# Patient Record
Sex: Female | Born: 1935 | Smoking: Current every day smoker
Health system: Southern US, Community
[De-identification: ages and names within clinical notes are randomized; demographics above are authoritative.]

## PROBLEM LIST (undated history)

## (undated) DIAGNOSIS — K6289 Other specified diseases of anus and rectum: Secondary | ICD-10-CM

## (undated) DIAGNOSIS — G3184 Mild cognitive impairment, so stated: Secondary | ICD-10-CM

## (undated) DIAGNOSIS — I10 Essential (primary) hypertension: Secondary | ICD-10-CM

## (undated) DIAGNOSIS — I872 Venous insufficiency (chronic) (peripheral): Secondary | ICD-10-CM

## (undated) DIAGNOSIS — D649 Anemia, unspecified: Secondary | ICD-10-CM

## (undated) DIAGNOSIS — H9190 Unspecified hearing loss, unspecified ear: Secondary | ICD-10-CM

## (undated) DIAGNOSIS — N189 Chronic kidney disease, unspecified: Secondary | ICD-10-CM

## (undated) DIAGNOSIS — E119 Type 2 diabetes mellitus without complications: Secondary | ICD-10-CM

## (undated) HISTORY — DX: Anemia, unspecified: D64.9

## (undated) HISTORY — DX: Essential (primary) hypertension: I10

## (undated) HISTORY — DX: Mild cognitive impairment of uncertain or unknown etiology: G31.84

## (undated) HISTORY — DX: Chronic kidney disease, unspecified: N18.9

## (undated) HISTORY — DX: Unspecified hearing loss, unspecified ear: H91.90

---

## 2006-03-23 ENCOUNTER — Emergency Department: Payer: Self-pay | Admitting: Internal Medicine

## 2007-03-02 ENCOUNTER — Ambulatory Visit: Payer: Self-pay | Admitting: Internal Medicine

## 2007-03-03 ENCOUNTER — Ambulatory Visit: Payer: Self-pay | Admitting: Internal Medicine

## 2007-03-30 ENCOUNTER — Ambulatory Visit: Payer: Self-pay | Admitting: Internal Medicine

## 2007-04-30 ENCOUNTER — Ambulatory Visit: Payer: Self-pay | Admitting: Internal Medicine

## 2007-05-30 ENCOUNTER — Ambulatory Visit: Payer: Self-pay | Admitting: Internal Medicine

## 2007-06-30 ENCOUNTER — Ambulatory Visit: Payer: Self-pay | Admitting: Internal Medicine

## 2008-06-23 IMAGING — CR DG RIBS 2V*L*
1 series · 2 of 2 positions shown · non-contrast
Comparison: none

REASON FOR EXAM: fall rib pain
COMMENTS:

PROCEDURE:     DXR - DXR RIBS LEFT UNILATERAL  - March 23, 2006  [DATE]
RESULT:     No prior studies are available for comparison.

[Series 1: view not recorded · 0.17mm/px · 2 of 2 slices shown]
[im 1/2]
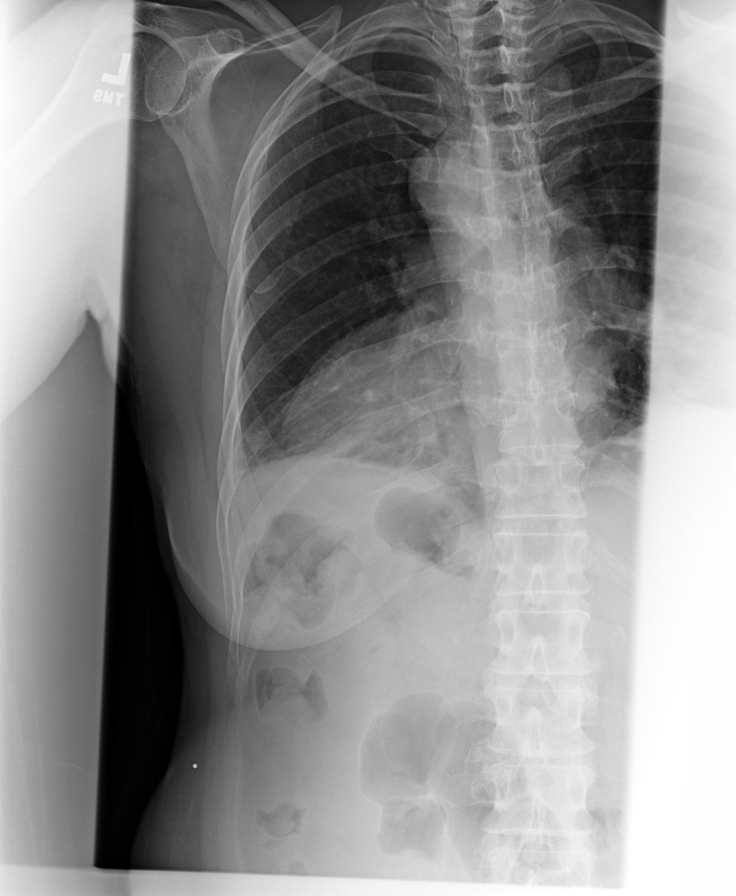
[im 2/2]
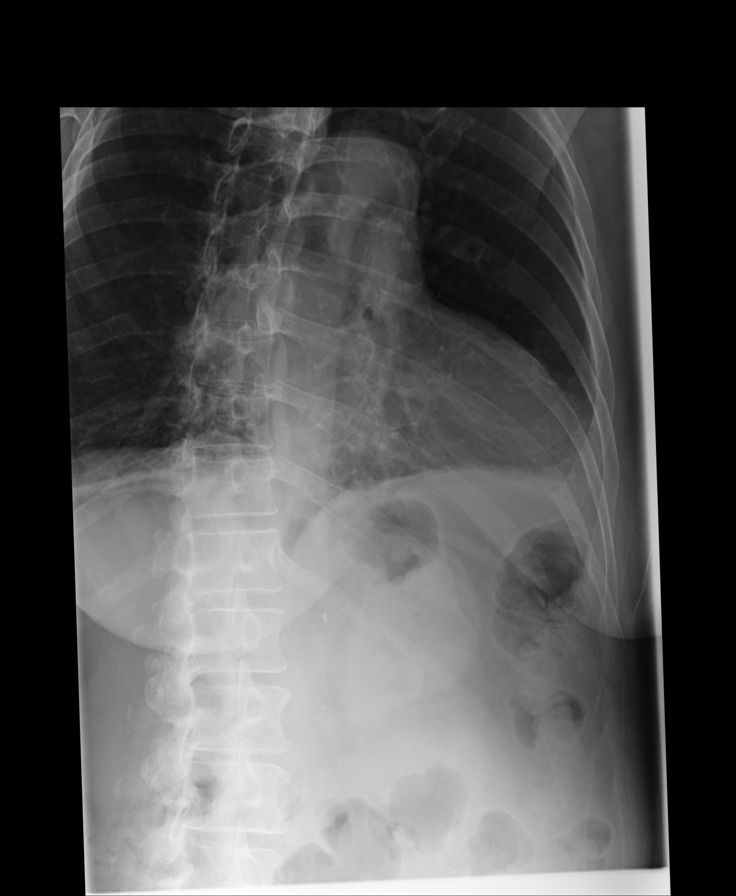

[2 of 2 positions shown; findings below may reference images not displayed]

FINDINGS: A radiopaque BB overlies the lower ribs, likely indicating the
site of pain. No displaced rib fractures are identified. There is bibasilar
subsegmental atelectasis. The remaining lungs are clear.
IMPRESSION: 1)No displaced rib fracture is identified.

## 2012-05-29 ENCOUNTER — Ambulatory Visit: Payer: Self-pay | Admitting: Ophthalmology

## 2012-05-29 DIAGNOSIS — I10 Essential (primary) hypertension: Secondary | ICD-10-CM

## 2012-06-02 ENCOUNTER — Ambulatory Visit: Payer: Self-pay | Admitting: Ophthalmology

## 2014-05-21 NOTE — Op Note (Signed)
PATIENT NAME:  Courtney Holt, BUSCH MR#:  [id number] DATE OF BIRTH:  03-26-1934  DATE OF PROCEDURE:  06/02/2012  PREOPERATIVE DIAGNOSIS:  Cataract, left eye.    POSTOPERATIVE DIAGNOSIS:  Cataract, left eye.  PROCEDURE PERFORMED:  Extracapsular cataract extraction using phacoemulsification with placement of an Alcon [id number], 26.0-diopter posterior chamber lens, serial [id number].  SURGEON:  Loura Back. Taleshia Luff, MD  ASSISTANT:  None.  ANESTHESIA:  4% lidocaine and 0.75% Marcaine in a 50/50 mixture with 10 units/mL of Hylenex added, given as a peribulbar.   ANESTHESIOLOGIST:  Dr. Benjamine Mola  COMPLICATIONS:  None.  ESTIMATED BLOOD LOSS:  Less than 1 ml.  DESCRIPTION OF PROCEDURE:  The patient was brought to the operating room and given a peribulbar block.  The patient was then prepped and draped in the usual fashion.  The vertical rectus muscles were imbricated using 5-0 silk sutures.  These sutures were then clamped to the sterile drapes as bridle sutures.  A limbal peritomy was performed extending two clock hours and hemostasis was obtained with cautery.  A partial thickness scleral groove was made at the surgical limbus and dissected anteriorly in a lamellar dissection using an Alcon crescent knife.  The anterior chamber was entered supero-temporally with a Superblade and through the lamellar dissection with a 2.6 mm keratome.  DisCoVisc was used to replace the aqueous and a continuous tear capsulorrhexis was carried out.  Hydrodissection and hydrodelineation were carried out with balanced salt and a 27 gauge canula.  The nucleus was rotated to confirm the effectiveness of the hydrodissection.  Phacoemulsification was carried out using a divide-and-conquer technique.  Total ultrasound time was 2 minutes and 20.1 seconds with an average power of 23.9 percent.  Irrigation/aspiration was used to remove the residual cortex.  DisCoVisc was used to inflate the capsule and the internal incision was  enlarged to 3 mm with the crescent knife.  The intraocular lens was folded and inserted into the capsular bag using the AcrySert delivery system.  Irrigation/aspiration was used to remove the residual DisCoVisc.  Miostat was injected into the anterior chamber through the paracentesis track to inflate the anterior chamber and induce miosis. A tenth of a milliliter of cefuroxime was placed into the anterior chamber via the paracentesis tract. The wound was checked for leaks and wound leakage was found.  A single 10-0 suture was placed across the incision, tied and the knot was rotated superiorly.  The conjunctiva was closed with cautery and the bridle sutures were removed.  Two drops of 0.3% Vigamox were placed on the eye.   An eye shield was placed on the eye.  The patient was discharged to the recovery room in good condition. ____________________________ Loura Back Jozsef Wescoat, MD sad:sb D: 06/02/2012 14:09:22 ET T: 06/02/2012 15:01:00 ET JOB#: [id number]  cc: Remo Lipps A. Heliodoro Domagalski, MD, <Dictator>  Martie Lee MD ELECTRONICALLY SIGNED 06/09/2012 14:41

## 2016-08-26 ENCOUNTER — Encounter: Payer: Self-pay | Admitting: Emergency Medicine

## 2016-08-26 ENCOUNTER — Emergency Department
Admission: EM | Admit: 2016-08-26 | Discharge: 2016-08-26 | Discharge status: Against Medical Advice | Payer: Medicare Other | Attending: Emergency Medicine | Admitting: Emergency Medicine

## 2016-08-26 DIAGNOSIS — T50901A Poisoning by unspecified drugs, medicaments and biological substances, accidental (unintentional), initial encounter: Secondary | ICD-10-CM

## 2016-08-26 DIAGNOSIS — D649 Anemia, unspecified: Secondary | ICD-10-CM | POA: Insufficient documentation

## 2016-08-26 DIAGNOSIS — R7989 Other specified abnormal findings of blood chemistry: Secondary | ICD-10-CM

## 2016-08-26 DIAGNOSIS — I10 Essential (primary) hypertension: Secondary | ICD-10-CM | POA: Insufficient documentation

## 2016-08-26 DIAGNOSIS — R778 Other specified abnormalities of plasma proteins: Secondary | ICD-10-CM

## 2016-08-26 DIAGNOSIS — F17200 Nicotine dependence, unspecified, uncomplicated: Secondary | ICD-10-CM | POA: Insufficient documentation

## 2016-08-26 DIAGNOSIS — R748 Abnormal levels of other serum enzymes: Secondary | ICD-10-CM | POA: Diagnosis not present

## 2016-08-26 DIAGNOSIS — T447X1A Poisoning by beta-adrenoreceptor antagonists, accidental (unintentional), initial encounter: Secondary | ICD-10-CM | POA: Diagnosis not present

## 2016-08-26 DIAGNOSIS — N289 Disorder of kidney and ureter, unspecified: Secondary | ICD-10-CM | POA: Insufficient documentation

## 2016-08-26 HISTORY — DX: Essential (primary) hypertension: I10

## 2016-08-26 LAB — CBC WITH DIFFERENTIAL/PLATELET
BASOS PCT: 0 %
Basophils Absolute: 0 10*3/uL (ref 0–0.1)
EOS ABS: 0 10*3/uL (ref 0–0.7)
EOS PCT: 1 %
HCT: 19.4 % — ABNORMAL LOW (ref 35.0–47.0)
Hemoglobin: 6.3 g/dL — ABNORMAL LOW (ref 12.0–16.0)
LYMPHS ABS: 2.6 10*3/uL (ref 1.0–3.6)
Lymphocytes Relative: 39 %
MCH: 28.8 pg (ref 26.0–34.0)
MCHC: 32.6 g/dL (ref 32.0–36.0)
MCV: 88.3 fL (ref 80.0–100.0)
MONOS PCT: 7 %
Monocytes Absolute: 0.4 10*3/uL (ref 0.2–0.9)
Neutro Abs: 3.4 10*3/uL (ref 1.4–6.5)
Neutrophils Relative %: 53 %
PLATELETS: 481 10*3/uL — AB (ref 150–440)
RBC: 2.2 MIL/uL — ABNORMAL LOW (ref 3.80–5.20)
RDW: 16.3 % — ABNORMAL HIGH (ref 11.5–14.5)
WBC: 6.5 10*3/uL (ref 3.6–11.0)

## 2016-08-26 LAB — COMPREHENSIVE METABOLIC PANEL
ALBUMIN: 3.7 g/dL (ref 3.5–5.0)
ALK PHOS: 82 U/L (ref 38–126)
ALT: 6 U/L — ABNORMAL LOW (ref 14–54)
AST: 14 U/L — AB (ref 15–41)
Anion gap: 7 (ref 5–15)
BUN: 16 mg/dL (ref 6–20)
CALCIUM: 9 mg/dL (ref 8.9–10.3)
CHLORIDE: 110 mmol/L (ref 101–111)
CO2: 22 mmol/L (ref 22–32)
CREATININE: 1.97 mg/dL — AB (ref 0.44–1.00)
GFR calc non Af Amer: 23 mL/min — ABNORMAL LOW (ref >60)
GFR, EST AFRICAN AMERICAN: 26 mL/min — AB (ref >60)
GLUCOSE: 124 mg/dL — AB (ref 65–99)
Potassium: 4.4 mmol/L (ref 3.5–5.1)
Sodium: 139 mmol/L (ref 135–145)
Total Bilirubin: 0.2 mg/dL — ABNORMAL LOW (ref 0.3–1.2)
Total Protein: 6.7 g/dL (ref 6.5–8.1)

## 2016-08-26 LAB — TROPONIN I: Troponin I: 0.05 ng/mL (ref <0.03)

## 2016-08-26 NOTE — ED Provider Notes (Signed)
[facility] Emergency Department Provider Note       Time seen: ----------------------------------------- 9:18 PM on 08/26/2016 -----------------------------------------     I have reviewed the triage vital signs and the nursing notes.   HISTORY   Chief Complaint Drug Overdose    HPI Alondra Vandeven is a 82 y.o. female who presents to the ED for accidental medication ingestion. Patient presents to the ER after taking 3 of her propranolol. Patient states the first dose was taken at 10 AM and then gradually throughout the day she had taken several more. She arrives with an elevated blood pressure and normal heart rate. She denies any physical complaints.   Past Medical History:  Diagnosis Date  . Hypertension     There are no active problems to display for this patient.   History reviewed. No pertinent surgical history.  Allergies Penicillins  Social History Social History  Substance Use Topics  . Smoking status: Current Every Day Smoker  . Smokeless tobacco: Never Used  . Alcohol use Yes    Review of Systems Constitutional: Negative for fever. Eyes: Negative for vision changes ENT:  Negative for congestion, sore throat Cardiovascular: Negative for chest pain. Respiratory: Negative for shortness of breath. Gastrointestinal: Negative for abdominal pain, vomiting and diarrhea. Genitourinary: Negative for dysuria. Musculoskeletal: Negative for back pain. Skin: Negative for rash. Neurological: Negative for headaches, focal weakness or numbness.  All systems negative/normal/unremarkable except as stated in the HPI  ____________________________________________   PHYSICAL EXAM:  VITAL SIGNS: ED Triage Vitals  Enc Vitals Group     BP 08/26/16 1859 (!) 160/123     Pulse Rate 08/26/16 1859 81     Resp 08/26/16 1859 16     Temp 08/26/16 1859 99.6 F (37.6 C)     Temp Source 08/26/16 1859 Oral     SpO2 08/26/16 1859 100 %     Weight  08/26/16 1855 101 lb (45.8 kg)     Height 08/26/16 1855 5' (1.524 m)     Head Circumference --      Peak Flow --      Pain Score --      Pain Loc --      Pain Edu? --      Excl. in [place]? --     Constitutional: Well appearing and in no distress. Eyes: Conjunctivae are normal. Normal extraocular movements. ENT   Head: Normocephalic and atraumatic.   Nose: No congestion/rhinnorhea.   Mouth/Throat: Mucous membranes are moist.   Neck: No stridor. Cardiovascular: Normal rate, regular rhythm. No murmurs, rubs, or gallops. Respiratory: Normal respiratory effort without tachypnea nor retractions. Breath sounds are clear and equal bilaterally. No wheezes/rales/rhonchi. Gastrointestinal: Soft and nontender. Normal bowel sounds Musculoskeletal: Nontender with normal range of motion in extremities. No lower extremity tenderness nor edema. Neurologic:  Normal speech and language. No gross focal neurologic deficits are appreciated. Tremor is noted Skin:  Skin is warm, dry and intact. Pallor is noted Psychiatric: Mood and affect are normal.  ____________________________________________  EKG: Interpreted by me. Sinus rhythm with a rate of 77 bpm, normal PR interval, normal QRS size, normal QT. Possible septal infarct age indeterminate  ____________________________________________  ED COURSE:  Pertinent labs & imaging results that were available during my care of the patient were reviewed by me and considered in my medical decision making (see chart for details). Patient presents for accidental medication ingestion, we will assess with labs and imaging as indicated.   Procedures ____________________________________________   LABS (  pertinent positives/negatives)  Labs Reviewed  CBC WITH DIFFERENTIAL/PLATELET - Abnormal; Notable for the following:       Result Value   RBC 2.20 (*)    Hemoglobin 6.3 (*)    HCT 19.4 (*)    RDW 16.3 (*)    Platelets 481 (*)    All other components  within normal limits  COMPREHENSIVE METABOLIC PANEL - Abnormal; Notable for the following:    Glucose, Bld 124 (*)    Creatinine, Ser 1.97 (*)    AST 14 (*)    ALT 6 (*)    Total Bilirubin 0.2 (*)    GFR calc non Af Amer 23 (*)    GFR calc Af Amer 26 (*)    All other components within normal limits  TROPONIN I - Abnormal; Notable for the following:    Troponin I 0.05 (*)    All other components within normal limits   ____________________________________________  FINAL ASSESSMENT AND PLAN  Accidental medication ingestion, Anemia, elevated troponin, renal insufficiency  Plan: Patient's labs were dictated above. Patient had presented for possible overdose on propranolol. At this point her heart rate and blood pressure are actually elevated and she appears stable from that perspective. However we have found multiple other issues that need to be addressed including anemia that qualifies for blood transfusion, renal insufficiency and elevated troponin. These may all be related to chronic renal insufficiency. However she has refused rectal examination, she has refused any further inpatient testing. I have advised her and her family cannot guarantee her safety. She is signing [place] paperwork. The family realizes they need to establish decision making capacity for her in that she likely is developing dementia, but seems to understand her current situation at this time and does not want to stay in the hospital.   Earleen Newport, MD   Note: This note was generated in part or whole with voice recognition software. Voice recognition is usually quite accurate but there are transcription errors that can and very often do occur. I apologize for any typographical errors that were not detected and corrected.     Earleen Newport, MD 08/26/16 2233

## 2016-08-26 NOTE — ED Triage Notes (Signed)
Pt to ED after taking 3 of her Propanilol. The first one was taken at 10 am and then gradually throughout the day. Pt's current BP is 160/123 and HR 81.

## 2016-08-26 NOTE — ED Notes (Addendum)
Pt refusing provider to exam her at this time. Pt agitated and wanting to leave. MD at bedside.

## 2016-08-26 NOTE — ED Notes (Addendum)
Health information provided to pt by MD about conditions discovered during ED visit. Pt and family verbalized understanding of information provided. Pt still adamantly refusing treatment and states "I'm not staying, I want to go home." Pt signed paper and computer copy of AMA form.

## 2021-10-24 ENCOUNTER — Inpatient Hospital Stay
Admission: EM | Admit: 2021-10-24 | Discharge: 2021-10-27 | DRG: 637 | Disposition: A | Discharge status: Home / Self Care | Payer: Medicare Other | Attending: Internal Medicine | Admitting: Internal Medicine

## 2021-10-24 ENCOUNTER — Emergency Department: Payer: Medicare Other

## 2021-10-24 DIAGNOSIS — I509 Heart failure, unspecified: Secondary | ICD-10-CM | POA: Diagnosis not present

## 2021-10-24 DIAGNOSIS — E1122 Type 2 diabetes mellitus with diabetic chronic kidney disease: Secondary | ICD-10-CM | POA: Diagnosis present

## 2021-10-24 DIAGNOSIS — N184 Chronic kidney disease, stage 4 (severe): Secondary | ICD-10-CM | POA: Diagnosis present

## 2021-10-24 DIAGNOSIS — E785 Hyperlipidemia, unspecified: Secondary | ICD-10-CM | POA: Diagnosis present

## 2021-10-24 DIAGNOSIS — K922 Gastrointestinal hemorrhage, unspecified: Secondary | ICD-10-CM | POA: Diagnosis present

## 2021-10-24 DIAGNOSIS — Z7982 Long term (current) use of aspirin: Secondary | ICD-10-CM

## 2021-10-24 DIAGNOSIS — E162 Hypoglycemia, unspecified: Secondary | ICD-10-CM | POA: Diagnosis present

## 2021-10-24 DIAGNOSIS — E861 Hypovolemia: Secondary | ICD-10-CM | POA: Diagnosis present

## 2021-10-24 DIAGNOSIS — R319 Hematuria, unspecified: Secondary | ICD-10-CM | POA: Diagnosis not present

## 2021-10-24 DIAGNOSIS — N179 Acute kidney failure, unspecified: Secondary | ICD-10-CM | POA: Diagnosis not present

## 2021-10-24 DIAGNOSIS — Z880 Allergy status to penicillin: Secondary | ICD-10-CM

## 2021-10-24 DIAGNOSIS — K623 Rectal prolapse: Secondary | ICD-10-CM | POA: Diagnosis present

## 2021-10-24 DIAGNOSIS — F17210 Nicotine dependence, cigarettes, uncomplicated: Secondary | ICD-10-CM | POA: Diagnosis present

## 2021-10-24 DIAGNOSIS — N189 Chronic kidney disease, unspecified: Secondary | ICD-10-CM | POA: Diagnosis present

## 2021-10-24 DIAGNOSIS — Z833 Family history of diabetes mellitus: Secondary | ICD-10-CM

## 2021-10-24 DIAGNOSIS — E11649 Type 2 diabetes mellitus with hypoglycemia without coma: Principal | ICD-10-CM | POA: Diagnosis present

## 2021-10-24 DIAGNOSIS — I5032 Chronic diastolic (congestive) heart failure: Secondary | ICD-10-CM | POA: Diagnosis present

## 2021-10-24 DIAGNOSIS — R4182 Altered mental status, unspecified: Secondary | ICD-10-CM

## 2021-10-24 DIAGNOSIS — Z8249 Family history of ischemic heart disease and other diseases of the circulatory system: Secondary | ICD-10-CM

## 2021-10-24 DIAGNOSIS — I130 Hypertensive heart and chronic kidney disease with heart failure and stage 1 through stage 4 chronic kidney disease, or unspecified chronic kidney disease: Secondary | ICD-10-CM | POA: Diagnosis present

## 2021-10-24 DIAGNOSIS — G9341 Metabolic encephalopathy: Secondary | ICD-10-CM | POA: Diagnosis present

## 2021-10-24 DIAGNOSIS — I10 Essential (primary) hypertension: Secondary | ICD-10-CM | POA: Diagnosis present

## 2021-10-24 DIAGNOSIS — K921 Melena: Secondary | ICD-10-CM | POA: Diagnosis present

## 2021-10-24 DIAGNOSIS — N2581 Secondary hyperparathyroidism of renal origin: Secondary | ICD-10-CM | POA: Diagnosis present

## 2021-10-24 DIAGNOSIS — H9190 Unspecified hearing loss, unspecified ear: Secondary | ICD-10-CM | POA: Diagnosis present

## 2021-10-24 DIAGNOSIS — D631 Anemia in chronic kidney disease: Secondary | ICD-10-CM | POA: Diagnosis present

## 2021-10-24 DIAGNOSIS — Z8673 Personal history of transient ischemic attack (TIA), and cerebral infarction without residual deficits: Secondary | ICD-10-CM

## 2021-10-24 DIAGNOSIS — D509 Iron deficiency anemia, unspecified: Secondary | ICD-10-CM | POA: Diagnosis present

## 2021-10-24 DIAGNOSIS — Z79899 Other long term (current) drug therapy: Secondary | ICD-10-CM

## 2021-10-24 DIAGNOSIS — Z20822 Contact with and (suspected) exposure to covid-19: Secondary | ICD-10-CM | POA: Diagnosis present

## 2021-10-24 DIAGNOSIS — Z7984 Long term (current) use of oral hypoglycemic drugs: Secondary | ICD-10-CM

## 2021-10-24 DIAGNOSIS — E119 Type 2 diabetes mellitus without complications: Secondary | ICD-10-CM

## 2021-10-24 LAB — CBG MONITORING, ED
Glucose-Capillary: 127 mg/dL — ABNORMAL HIGH (ref 70–99)
Glucose-Capillary: 24 mg/dL — CL (ref 70–99)
Glucose-Capillary: 61 mg/dL — ABNORMAL LOW (ref 70–99)
Glucose-Capillary: 68 mg/dL — ABNORMAL LOW (ref 70–99)
Glucose-Capillary: 71 mg/dL (ref 70–99)
Glucose-Capillary: 72 mg/dL (ref 70–99)

## 2021-10-24 LAB — COMPREHENSIVE METABOLIC PANEL
ALT: 12 U/L (ref 0–44)
AST: 27 U/L (ref 15–41)
Albumin: 3.1 g/dL — ABNORMAL LOW (ref 3.5–5.0)
Alkaline Phosphatase: 75 U/L (ref 38–126)
Anion gap: 8 (ref 5–15)
BUN: 32 mg/dL — ABNORMAL HIGH (ref 8–23)
CO2: 19 mmol/L — ABNORMAL LOW (ref 22–32)
Calcium: 8.9 mg/dL (ref 8.9–10.3)
Chloride: 117 mmol/L — ABNORMAL HIGH (ref 98–111)
Creatinine, Ser: 3.33 mg/dL — ABNORMAL HIGH (ref 0.44–1.00)
GFR, Estimated: 13 mL/min — ABNORMAL LOW (ref >60)
Glucose, Bld: 69 mg/dL — ABNORMAL LOW (ref 70–99)
Potassium: 3.5 mmol/L (ref 3.5–5.1)
Sodium: 144 mmol/L (ref 135–145)
Total Bilirubin: 0.6 mg/dL (ref 0.3–1.2)
Total Protein: 6.6 g/dL (ref 6.5–8.1)

## 2021-10-24 LAB — CBC WITH DIFFERENTIAL/PLATELET
Abs Immature Granulocytes: 0.03 10*3/uL (ref 0.00–0.07)
Basophils Absolute: 0 10*3/uL (ref 0.0–0.1)
Basophils Relative: 0 %
Eosinophils Absolute: 0 10*3/uL (ref 0.0–0.5)
Eosinophils Relative: 0 %
HCT: 31.8 % — ABNORMAL LOW (ref 36.0–46.0)
Hemoglobin: 10.1 g/dL — ABNORMAL LOW (ref 12.0–15.0)
Immature Granulocytes: 0 %
Lymphocytes Relative: 14 %
Lymphs Abs: 1.1 10*3/uL (ref 0.7–4.0)
MCH: 27.7 pg (ref 26.0–34.0)
MCHC: 31.8 g/dL (ref 30.0–36.0)
MCV: 87.1 fL (ref 80.0–100.0)
Monocytes Absolute: 0.5 10*3/uL (ref 0.1–1.0)
Monocytes Relative: 6 %
Neutro Abs: 6.1 10*3/uL (ref 1.7–7.7)
Neutrophils Relative %: 80 %
Platelets: 380 10*3/uL (ref 150–400)
RBC: 3.65 MIL/uL — ABNORMAL LOW (ref 3.87–5.11)
RDW: 20.4 % — ABNORMAL HIGH (ref 11.5–15.5)
WBC: 7.7 10*3/uL (ref 4.0–10.5)
nRBC: 0 % (ref 0.0–0.2)

## 2021-10-24 LAB — RESP PANEL BY RT-PCR (FLU A&B, COVID) ARPGX2
Influenza A by PCR: NEGATIVE
Influenza B by PCR: NEGATIVE
SARS Coronavirus 2 by RT PCR: NEGATIVE

## 2021-10-24 LAB — LACTIC ACID, PLASMA: Lactic Acid, Venous: 1 mmol/L (ref 0.5–1.9)

## 2021-10-24 LAB — BRAIN NATRIURETIC PEPTIDE: B Natriuretic Peptide: 645.4 pg/mL — ABNORMAL HIGH (ref 0.0–100.0)

## 2021-10-24 LAB — CK: Total CK: 228 U/L (ref 38–234)

## 2021-10-24 MED ORDER — TRAZODONE HCL 50 MG PO TABS
25.0000 mg | ORAL_TABLET | Freq: Every evening | ORAL | Status: DC | PRN
  Administered 2021-10-25 – 2021-10-27 (×2): 25 mg via ORAL
  Filled 2021-10-24 (×2): qty 1

## 2021-10-24 MED ORDER — ENOXAPARIN SODIUM 40 MG/0.4ML IJ SOSY: 40.0000 mg | PREFILLED_SYRINGE | INTRAMUSCULAR | Status: DC

## 2021-10-24 MED ORDER — DEXTROSE 50 % IV SOLN
1.0000 | Freq: Once | INTRAVENOUS | Status: AC
  Administered 2021-10-24: 50 mL via INTRAVENOUS
  Filled 2021-10-24: qty 50

## 2021-10-24 MED ORDER — ONDANSETRON HCL 4 MG PO TABS: 4.0000 mg | ORAL_TABLET | Freq: Four times a day (QID) | ORAL | Status: DC | PRN

## 2021-10-24 MED ORDER — FUROSEMIDE 10 MG/ML IJ SOLN
20.0000 mg | Freq: Two times a day (BID) | INTRAMUSCULAR | Status: DC
  Administered 2021-10-24 – 2021-10-26 (×4): 20 mg via INTRAVENOUS
  Filled 2021-10-24 (×4): qty 4

## 2021-10-24 MED ORDER — ACETAMINOPHEN 650 MG RE SUPP: 650.0000 mg | Freq: Four times a day (QID) | RECTAL | Status: DC | PRN

## 2021-10-24 MED ORDER — DEXTROSE-NACL 5-0.45 % IV SOLN: INTRAVENOUS | Status: DC

## 2021-10-24 MED ORDER — HEPARIN SODIUM (PORCINE) 5000 UNIT/ML IJ SOLN
5000.0000 [IU] | Freq: Three times a day (TID) | INTRAMUSCULAR | Status: DC
  Administered 2021-10-24 – 2021-10-27 (×8): 5000 [IU] via SUBCUTANEOUS
  Filled 2021-10-24 (×8): qty 1

## 2021-10-24 MED ORDER — ACETAMINOPHEN 325 MG PO TABS
650.0000 mg | ORAL_TABLET | Freq: Four times a day (QID) | ORAL | Status: DC | PRN
  Administered 2021-10-25: 650 mg via ORAL
  Filled 2021-10-24: qty 2

## 2021-10-24 MED ORDER — POLYETHYLENE GLYCOL 3350 17 G PO PACK: 17.0000 g | PACK | Freq: Every day | ORAL | Status: DC | PRN

## 2021-10-24 MED ORDER — DEXTROSE 50 % IV SOLN
INTRAVENOUS | Status: AC
  Administered 2021-10-25: 12.5 g via INTRAVENOUS
  Filled 2021-10-24: qty 50

## 2021-10-24 MED ORDER — SODIUM CHLORIDE 0.9 % IV BOLUS
1000.0000 mL | Freq: Once | INTRAVENOUS | Status: AC
  Administered 2021-10-24: 1000 mL via INTRAVENOUS

## 2021-10-24 MED ORDER — DEXTROSE-NACL 5-0.9 % IV SOLN: INTRAVENOUS | Status: DC

## 2021-10-24 MED ORDER — INSULIN ASPART 100 UNIT/ML IJ SOLN: 0.0000 [IU] | Freq: Every day | INTRAMUSCULAR | Status: DC

## 2021-10-24 MED ORDER — INSULIN ASPART 100 UNIT/ML IJ SOLN: 0.0000 [IU] | Freq: Three times a day (TID) | INTRAMUSCULAR | Status: DC

## 2021-10-24 MED ORDER — ONDANSETRON HCL 4 MG/2ML IJ SOLN
4.0000 mg | Freq: Four times a day (QID) | INTRAMUSCULAR | Status: DC | PRN
  Administered 2021-10-24 – 2021-10-25 (×2): 4 mg via INTRAVENOUS
  Filled 2021-10-24 (×2): qty 2

## 2021-10-24 MED ORDER — MAGNESIUM HYDROXIDE 400 MG/5ML PO SUSP: 30.0000 mL | Freq: Every day | ORAL | Status: DC | PRN

## 2021-10-24 MED ORDER — DEXTROSE 50 % IV SOLN: 12.5000 g | Freq: Once | INTRAVENOUS | Status: AC

## 2021-10-24 MED ORDER — POTASSIUM CHLORIDE 20 MEQ PO PACK
40.0000 meq | PACK | Freq: Once | ORAL | Status: DC
  Filled 2021-10-24: qty 2

## 2021-10-24 NOTE — Assessment & Plan Note (Signed)
continue her antihypertensives. 

## 2021-10-24 NOTE — Assessment & Plan Note (Signed)
-   She will be placed on supplement coverage with NovoLog and we will hold off her glipizide for now.

## 2021-10-24 NOTE — ED Provider Notes (Signed)
[facility] Provider Note    Event Date/Time   First MD Initiated Contact with Patient 10/24/21 1647     (approximate)   History   Hypoglycemia   HPI  Sejal Cofield is a 87 y.o. female with a history of hypertension and anemia but no other known medical problems who normally is alert and oriented baseline and who presents with altered mental status.  EMS reports that the family found her unresponsive today.  She was last seen at her baseline yesterday.  The patient herself is able to answer a few basic questions but cannot give any further history.  I reviewed the past medical records.  The patient was seen in the ED in 2018 for accidental medication ingestion.  Her most recent outpatient encounter was an iron infusion at [facility] in 2019 for iron deficiency anemia.   Physical Exam   Triage Vital Signs: ED Triage Vitals  Enc Vitals Group     BP 10/24/21 1655 (!) 165/90     Pulse Rate 10/24/21 1655 96     Resp 10/24/21 1655 (!) 22     Temp 10/24/21 1700 98.9 F (37.2 C)     Temp Source 10/24/21 1700 Oral     SpO2 10/24/21 1655 94 %     Weight --      Height --      Head Circumference --      Peak Flow --      Pain Score --      Pain Loc --      Pain Edu? --      Excl. in [place]? --     Most recent vital signs: Vitals:   10/24/21 1700 10/24/21 2300  BP:    Pulse:  (!) 105  Resp:  20  Temp: 98.9 F (37.2 C)   SpO2:  94%     General: Awake, confused appearing.  Oriented x1. CV:  Good peripheral perfusion.  Resp:  Normal effort.  Abd:  No distention.  Other:  Dry mucous membranes.  EOMI.  PERRLA.  Motor intact in all extremities.  Rectal prolapse.   ED Results / Procedures / Treatments   Labs (all labs ordered are listed, but only abnormal results are displayed) Labs Reviewed  COMPREHENSIVE METABOLIC PANEL - Abnormal; Notable for the following components:      Result Value   Chloride 117 (*)    CO2 19 (*)    Glucose, Bld 69 (*)     BUN 32 (*)    Creatinine, Ser 3.33 (*)    Albumin 3.1 (*)    GFR, Estimated 13 (*)    All other components within normal limits  CBC WITH DIFFERENTIAL/PLATELET - Abnormal; Notable for the following components:   RBC 3.65 (*)    Hemoglobin 10.1 (*)    HCT 31.8 (*)    RDW 20.4 (*)    All other components within normal limits  BRAIN NATRIURETIC PEPTIDE - Abnormal; Notable for the following components:   B Natriuretic Peptide 645.4 (*)    All other components within normal limits  CBG MONITORING, ED - Abnormal; Notable for the following components:   Glucose-Capillary 24 (*)    All other components within normal limits  CBG MONITORING, ED - Abnormal; Notable for the following components:   Glucose-Capillary 127 (*)    All other components within normal limits  CBG MONITORING, ED - Abnormal; Notable for the following components:   Glucose-Capillary 68 (*)  All other components within normal limits  RESP PANEL BY RT-PCR (FLU A&B, COVID) ARPGX2  CULTURE, BLOOD (ROUTINE X 2)  CULTURE, BLOOD (ROUTINE X 2)  LACTIC ACID, PLASMA  CK  LACTIC ACID, PLASMA  BASIC METABOLIC PANEL  CBC  HEMOGLOBIN A1C  URINALYSIS, ROUTINE W REFLEX MICROSCOPIC  CBG MONITORING, ED  CBG MONITORING, ED     EKG  ED ECG REPORT I, Arta Silence, the attending physician, personally viewed and interpreted this ECG.  Date: 10/24/2021 EKG Time: 1648 Rate: 102 Rhythm: Sinus tachycardia QRS Axis: normal Intervals: normal ST/T Wave abnormalities: Nonspecific ST abnormalities Narrative Interpretation: Nonspecific abnormalities with no evidence of acute ischemia    RADIOLOGY  CT head: I independently viewed and interpreted the images; there is no ICH  Chest x-ray: I independently viewed and interpreted the images; there is cardiomegaly with no focal consolidation or edema  PROCEDURES:  Critical Care performed: No  Hernia reduction  Date/Time: 10/24/2021 5:26 PM  Performed by: Arta Silence, MD Authorized by: Arta Silence, MD  Consent: The procedure was performed in an emergent situation. Verbal consent obtained. Consent given by: patient Patient identity confirmed: verbally with patient and arm band Patient tolerance: patient tolerated the procedure well with no immediate complications Comments: White sugar applied to the prolapsed rectal tissue.  After several minutes it significantly reduced in size.  Manual prolapse reduction was successful.      MEDICATIONS ORDERED IN ED: Medications  dextrose 5 %-0.45 % sodium chloride infusion ( Intravenous Restarted 10/24/21 2203)  acetaminophen (TYLENOL) tablet 650 mg (has no administration in time range)    Or  acetaminophen (TYLENOL) suppository 650 mg (has no administration in time range)  traZODone (DESYREL) tablet 25 mg (has no administration in time range)  ondansetron (ZOFRAN) tablet 4 mg (has no administration in time range)    Or  ondansetron (ZOFRAN) injection 4 mg (has no administration in time range)  potassium chloride (KLOR-CON) packet 40 mEq (40 mEq Oral Not Given 10/24/21 2209)  insulin aspart (novoLOG) injection 0-9 Units (has no administration in time range)  insulin aspart (novoLOG) injection 0-5 Units ( Subcutaneous Not Given 10/24/21 2118)  heparin injection 5,000 Units (5,000 Units Subcutaneous Given 10/24/21 2209)  polyethylene glycol (MIRALAX / GLYCOLAX) packet 17 g (has no administration in time range)  furosemide (LASIX) injection 20 mg (20 mg Intravenous Given 10/24/21 2208)  sodium chloride 0.9 % bolus 1,000 mL (0 mLs Intravenous Stopped 10/24/21 1942)  dextrose 50 % solution 50 mL (50 mLs Intravenous Given 10/24/21 1907)     IMPRESSION / MDM / ASSESSMENT AND PLAN / ED COURSE  I reviewed the triage vital signs and the nursing notes.  87 year old female with PMH as noted above presents with altered mental status.  On exam the patient is alert but confused and disoriented.  She is  hypertensive with otherwise normal vital signs.  Physical exam is also notable for a rectal prolapse.  Differential diagnosis includes, but is not limited to, acute infection/sepsis, dehydration, electrolyte abnormality, AKI, uremia, other metabolic disturbance, intracranial hemorrhage, acute CVA, or other CNS cause, less likely cardiac etiology.  We will obtain CT head, chest x-ray, lab work-up, and reassess.  I reviewed the past records and did not see any reference to a rectal prolapse in the prior available records.  The prolapse appears acute as the prolapse tissue is moist and well vascularized.  I attempted reduction manually which was unsuccessful.  We then applied sugar and after several minutes I was  able to reduce the prolapse without difficulty.  The patient tolerated the procedure well with no immediate complications.  Patient's presentation is most consistent with acute presentation with potential threat to life or bodily function.  The patient is on the cardiac monitor to evaluate for evidence of arrhythmia and/or significant heart rate changes.  ----------------------------------------- 8:17 PM on 10/24/2021 -----------------------------------------  The patient had recurrent hypoglycemia and I suspect that this is a primary cause of her altered mental status.  We gave an amp of D50 and I have put her on a D5 half NS infusion.  The family reports that the patient is on glipizide but not on any insulin.  Other lab work-up is overall reassuring.  The patient has a mild AKI and a slightly elevated BNP.  Lactate is normal.  CK is normal.  COVID is negative.  I consulted Dr. Sidney Ace from the hospitalist service; based on our discussion he agrees to admit the patient.    FINAL CLINICAL IMPRESSION(S) / ED DIAGNOSES   Final diagnoses:  Altered mental status, unspecified altered mental status type  Hypoglycemia     Rx / DC Orders   ED Discharge Orders     None         Note:  This document was prepared using Dragon voice recognition software and may include unintentional dictation errors.    Arta Silence, MD 10/24/21 [phone]

## 2021-10-24 NOTE — Assessment & Plan Note (Addendum)
-   This is in the setting of elevated BNP though without significant symptoms of CHF. - hold off on IV fluids. - continue diuresis with IV Lasix. - Pending 2D echo.

## 2021-10-24 NOTE — H&P (Signed)
[place]   PATIENT NAME: Courtney Holt    MR#:  [id number]  DATE OF BIRTH:  [birth year]  DATE OF ADMISSION:  10/24/2021  PRIMARY CARE PHYSICIAN: [facility], [place]   Patient is coming from: Home  REQUESTING/REFERRING PHYSICIAN: Ane Payment, MD  CHIEF COMPLAINT:   Chief Complaint  Patient presents with   Hypoglycemia    HISTORY OF PRESENT ILLNESS:  Jacinda Kanady is a 87 y.o. African-American female with medical history significant for essential anemia, hypertension and type 2 diabetes mellitus, who presented to the emergency room with acute onset of altered mental status with unresponsiveness earlier today.  The patient was last seen at her normal state of health at 9 AM by family and when they came back around 3 PM they found her on the floor.  Her blood glucose was 34 initially.  She was so weak per her daughter and could not comprehend her.  She was in and out of responsiveness per her daughter.  She denies any significant dyspnea or cough or wheezing.  No significant orthopnea or paroxysmal nocturnal dyspnea.  She has occasional lower extremity edema per her daughter.  No chest pain or palpitations.  No nausea or vomiting or diarrhea.  She was noted to have rectal prolapse by the ER physician that was reduced.  ED Course: When she the ER, BP was 165/90 with respiratory rate of 22 and otherwise normal vital signs.  Labs revealed borderline potassium of 3.5 and CO2 of 19 with a glucose of 69 and BUN 32 with a creatinine of 3.33 compared to 16/1.97 on 08/26/2016.  Total CK was 228 and lactic acid 1.  CBC showed anemia better than previous levels with hemoglobin of 10.1 hematocrit 31.8.  Influenza antigens and COVID-19 PCR came back negative.  2 blood cultures were drawn.  I ordered a BNP and it came back elevated at 645.4 EKG as reviewed by me : EKG showed sinus tachycardia with a rate of 102 and multiple premature ventricular complexes and anteroseptal Q  waves. Imaging: Noncontrasted CT scan revealed a chronic right posterior parietal and left frontal parietal infarcts, small bilateral basal ganglia lacunar infarcts and generalized cerebral atrophy with chronic white matter small vessel ischemic changes without evidence of acute intracranial abnormality.  Portable chest x-ray showed cardiomegaly with vascular congestion and pleural effusions.  The patient was given an amp of D50 followed by D5 half-normal saline at 75 mill per hour.  She will be admitted to an observation medical telemetry bed for further evaluation and management. PAST MEDICAL HISTORY:   Past Medical History:  Diagnosis Date   Hypertension   -Type II diabetes mellitus without complications. -History of iron deficiency anemia and iron transfusion. - Dyslipidemia. PAST SURGICAL HISTORY:  History reviewed. No pertinent surgical history.  None.  SOCIAL HISTORY:   Social History   Tobacco Use   Smoking status: Every Day   Smokeless tobacco: Never  Substance Use Topics   Alcohol use: Yes    FAMILY HISTORY:  Positive for hypertension, diabetes mellitus, MI in her mother and CVA in her sister.  DRUG ALLERGIES:   Allergies  Allergen Reactions   Penicillins     REVIEW OF SYSTEMS:   ROS As per history of present illness. All pertinent systems were reviewed above. Constitutional, HEENT, cardiovascular, respiratory, GI, GU, musculoskeletal, neuro, psychiatric, endocrine, integumentary and hematologic systems were reviewed and are otherwise negative/unremarkable except for positive findings mentioned above in the HPI.   MEDICATIONS  AT HOME:   Prior to Admission medications   Not on File      VITAL SIGNS:  Blood pressure (!) 165/90, pulse 96, temperature 98.9 F (37.2 C), temperature source Oral, resp. rate (!) 22, SpO2 94 %.  PHYSICAL EXAMINATION:  Physical Exam  GENERAL:  87 y.o.-year-old African-American female patient lying in the bed with no acute  distress.  EYES: Pupils equal, round, reactive to light and accommodation. No scleral icterus. Extraocular muscles intact.  HEENT: Head atraumatic, normocephalic. Oropharynx and nasopharynx clear.  NECK:  Supple, no jugular venous distention. No thyroid enlargement, no tenderness.  LUNGS: Normal breath sounds bilaterally, no wheezing, rales,rhonchi or crepitation. No use of accessory muscles of respiration.  CARDIOVASCULAR: Regular rate and rhythm, S1, S2 normal. No murmurs, rubs, or gallops.  ABDOMEN: Soft, nondistended, nontender. Bowel sounds present. No organomegaly or mass.  EXTREMITIES: No pedal edema, cyanosis, or clubbing.  NEUROLOGIC: Cranial nerves II through XII are intact. Muscle strength 5/5 in all extremities. Sensation intact. Gait not checked.  PSYCHIATRIC: The patient is alert and oriented x 3.  Normal affect and good eye contact. SKIN: No obvious rash, lesion, or ulcer.   LABORATORY PANEL:   CBC Recent Labs  Lab 10/24/21 1700  WBC 7.7  HGB 10.1*  HCT 31.8*  PLT 380   ------------------------------------------------------------------------------------------------------------------  Chemistries  Recent Labs  Lab 10/24/21 1700  NA 144  K 3.5  CL 117*  CO2 19*  GLUCOSE 69*  BUN 32*  CREATININE 3.33*  CALCIUM 8.9  AST 27  ALT 12  ALKPHOS 75  BILITOT 0.6   ------------------------------------------------------------------------------------------------------------------  Cardiac Enzymes No results for input(s): "TROPONINI" in the last 168 hours. ------------------------------------------------------------------------------------------------------------------  RADIOLOGY:  CT Head Wo Contrast  Result Date: 10/24/2021 CLINICAL DATA:  Altered mental status. EXAM: CT HEAD WITHOUT CONTRAST TECHNIQUE: Contiguous axial images were obtained from the base of the skull through the vertex without intravenous contrast. RADIATION DOSE REDUCTION: This exam was performed  according to the departmental dose-optimization program which includes automated exposure control, adjustment of the mA and/or kV according to patient size and/or use of iterative reconstruction technique. COMPARISON:  None Available. FINDINGS: Brain: There is moderate severity cerebral atrophy with widening of the extra-axial spaces and ventricular dilatation. There are areas of decreased attenuation within the white matter tracts of the supratentorial brain, consistent with microvascular disease changes. 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 Small, bilateral basal ganglia lacunar infarcts are noted. Vascular: No hyperdense vessel or unexpected calcification. Skull: Normal. Negative for fracture or focal lesion. Sinuses/Orbits: No acute finding. Other: None. IMPRESSION: 1. Generalized cerebral atrophy and chronic white matter small vessel ischemic changes without evidence of an acute intracranial abnormality. 2. 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 3. Small, bilateral basal ganglia lacunar infarcts. Electronically Signed   By: Virgina Norfolk M.D.   On: 10/24/2021 17:42   DG Chest Port 1 View  Result Date: 10/24/2021 CLINICAL DATA:  Unresponsive EXAM: PORTABLE CHEST 1 VIEW COMPARISON:  03/23/2006 FINDINGS: Cardiomegaly with at least small bilateral effusions. Probable skin fold artifact right chest. Vascular congestion. Patchy atelectasis at the bases. No definitive pneumothorax. IMPRESSION: Cardiomegaly with vascular congestion and pleural effusions. Electronically Signed   By: Donavan Foil M.D.   On: 10/24/2021 17:12      IMPRESSION AND PLAN:  Assessment and Plan: * Hypoglycemia - The patient was admitted to an observation medical telemetry bed. - We will hold off her glipizide. - She will be placed  on supplement coverage with NovoLog. - We will utilize hypoglycemia protocol.  Pleural effusion due to CHF (congestive heart  failure) (HCC) - This is in the setting of elevated BNP though without significant symptoms of CHF. - We will hold off on IV fluids. - We will diurese with IV Lasix. - We will check a 2D echo.  Acute kidney injury superimposed on chronic kidney disease ([place]) - This is acute kidney injury superimposed on stage IV chronic kidney disease. - This is likely prerenal and it could be related to mild acute CHF due to renal hypoperfusion. -We will follow BMPs with current diuresis.  Essential hypertension - We will continue her antihypertensives.  Diabetes mellitus type 2, controlled, without complications ([place]) - She will be placed on supplement coverage with NovoLog and we will hold off her glipizide for now.  Dyslipidemia - We will continue statin therapy.  History of CVA (cerebrovascular accident) - We will continue aspirin.   DVT prophylaxis: Subcutaneous heparin. Advanced Care Planning:  Code Status: full code. Family Communication:  The plan of care was discussed in details with the patient (and family). I answered all questions. The patient agreed to proceed with the above mentioned plan. Further management will depend upon hospital course. Disposition Plan: Back to previous home environment Consults called: none. All the records are reviewed and case discussed with ED provider.  Status is: Observation  I certify that at the time of admission, it is my clinical judgment that the patient will require hospital care extending less than 2 midnights.                            Dispo: The patient is from: Home              Anticipated d/c is to: Home              Patient currently is not medically stable to d/c.              Difficult to place patient: No  Christel Mormon M.D on 10/24/2021 at 10:15 PM  [facility]   From 7 PM-7 AM, contact night-coverage [email or web address]  CC: Primary care physician; [employer], [employer]

## 2021-10-24 NOTE — ED Notes (Addendum)
Pt found to have rectal prolapse, MD Siadecki made aware.

## 2021-10-24 NOTE — Assessment & Plan Note (Signed)
-   The patient was admitted to an observation medical telemetry bed. - We will hold off her glipizide. - She will be placed on supplement coverage with NovoLog. - We will utilize hypoglycemia protocol.

## 2021-10-24 NOTE — Assessment & Plan Note (Signed)
Holding aspirin due to concern for GI bleed

## 2021-10-24 NOTE — ED Triage Notes (Signed)
Pt to ED via EMS from home. Pt was found unresponsive by family on scene. EMS reports CBG at 34 and gave 200 of D10 which rose to 218. Last known well was yesterday. Last CBG at 138  Pt has hx of diabetes and HTN.   Oral 98.7 Co2 20-25 138 cbg BP 170/105

## 2021-10-24 NOTE — Assessment & Plan Note (Signed)
-   This is acute kidney injury superimposed on stage IV chronic kidney disease. - This is likely prerenal and it could be related to mild acute CHF due to renal hypoperfusion. -We will follow BMPs with current diuresis.

## 2021-10-24 NOTE — Assessment & Plan Note (Signed)
-   continue statin therapy. 

## 2021-10-25 ENCOUNTER — Observation Stay (HOSPITAL_COMMUNITY)
Admit: 2021-10-25 | Discharge: 2021-10-25 | Disposition: A | Discharge status: Home / Self Care | Payer: Medicare Other | Attending: Family Medicine | Admitting: Family Medicine

## 2021-10-25 ENCOUNTER — Other Ambulatory Visit: Payer: Self-pay

## 2021-10-25 DIAGNOSIS — R319 Hematuria, unspecified: Secondary | ICD-10-CM | POA: Diagnosis not present

## 2021-10-25 DIAGNOSIS — E785 Hyperlipidemia, unspecified: Secondary | ICD-10-CM | POA: Diagnosis present

## 2021-10-25 DIAGNOSIS — E861 Hypovolemia: Secondary | ICD-10-CM | POA: Diagnosis present

## 2021-10-25 DIAGNOSIS — E11649 Type 2 diabetes mellitus with hypoglycemia without coma: Secondary | ICD-10-CM | POA: Diagnosis present

## 2021-10-25 DIAGNOSIS — E1122 Type 2 diabetes mellitus with diabetic chronic kidney disease: Secondary | ICD-10-CM | POA: Diagnosis present

## 2021-10-25 DIAGNOSIS — I130 Hypertensive heart and chronic kidney disease with heart failure and stage 1 through stage 4 chronic kidney disease, or unspecified chronic kidney disease: Secondary | ICD-10-CM | POA: Diagnosis present

## 2021-10-25 DIAGNOSIS — G9341 Metabolic encephalopathy: Secondary | ICD-10-CM | POA: Diagnosis present

## 2021-10-25 DIAGNOSIS — R4182 Altered mental status, unspecified: Secondary | ICD-10-CM | POA: Diagnosis not present

## 2021-10-25 DIAGNOSIS — Z7189 Other specified counseling: Secondary | ICD-10-CM | POA: Diagnosis not present

## 2021-10-25 DIAGNOSIS — I5031 Acute diastolic (congestive) heart failure: Secondary | ICD-10-CM | POA: Diagnosis not present

## 2021-10-25 DIAGNOSIS — D631 Anemia in chronic kidney disease: Secondary | ICD-10-CM | POA: Diagnosis present

## 2021-10-25 DIAGNOSIS — K922 Gastrointestinal hemorrhage, unspecified: Secondary | ICD-10-CM | POA: Diagnosis present

## 2021-10-25 DIAGNOSIS — F17210 Nicotine dependence, cigarettes, uncomplicated: Secondary | ICD-10-CM | POA: Diagnosis present

## 2021-10-25 DIAGNOSIS — Z8249 Family history of ischemic heart disease and other diseases of the circulatory system: Secondary | ICD-10-CM | POA: Diagnosis not present

## 2021-10-25 DIAGNOSIS — I10 Essential (primary) hypertension: Secondary | ICD-10-CM | POA: Diagnosis not present

## 2021-10-25 DIAGNOSIS — H9190 Unspecified hearing loss, unspecified ear: Secondary | ICD-10-CM | POA: Diagnosis present

## 2021-10-25 DIAGNOSIS — Z20822 Contact with and (suspected) exposure to covid-19: Secondary | ICD-10-CM | POA: Diagnosis present

## 2021-10-25 DIAGNOSIS — N2581 Secondary hyperparathyroidism of renal origin: Secondary | ICD-10-CM | POA: Diagnosis present

## 2021-10-25 DIAGNOSIS — Z8673 Personal history of transient ischemic attack (TIA), and cerebral infarction without residual deficits: Secondary | ICD-10-CM | POA: Diagnosis not present

## 2021-10-25 DIAGNOSIS — Z7984 Long term (current) use of oral hypoglycemic drugs: Secondary | ICD-10-CM | POA: Diagnosis not present

## 2021-10-25 DIAGNOSIS — K921 Melena: Secondary | ICD-10-CM | POA: Diagnosis present

## 2021-10-25 DIAGNOSIS — D509 Iron deficiency anemia, unspecified: Secondary | ICD-10-CM | POA: Diagnosis present

## 2021-10-25 DIAGNOSIS — Z7982 Long term (current) use of aspirin: Secondary | ICD-10-CM | POA: Diagnosis not present

## 2021-10-25 DIAGNOSIS — I509 Heart failure, unspecified: Secondary | ICD-10-CM | POA: Diagnosis not present

## 2021-10-25 DIAGNOSIS — I5032 Chronic diastolic (congestive) heart failure: Secondary | ICD-10-CM | POA: Diagnosis present

## 2021-10-25 DIAGNOSIS — Z880 Allergy status to penicillin: Secondary | ICD-10-CM | POA: Diagnosis not present

## 2021-10-25 DIAGNOSIS — N184 Chronic kidney disease, stage 4 (severe): Secondary | ICD-10-CM | POA: Diagnosis present

## 2021-10-25 DIAGNOSIS — N179 Acute kidney failure, unspecified: Secondary | ICD-10-CM | POA: Diagnosis present

## 2021-10-25 DIAGNOSIS — K623 Rectal prolapse: Secondary | ICD-10-CM | POA: Diagnosis present

## 2021-10-25 DIAGNOSIS — E162 Hypoglycemia, unspecified: Secondary | ICD-10-CM | POA: Diagnosis present

## 2021-10-25 DIAGNOSIS — Z833 Family history of diabetes mellitus: Secondary | ICD-10-CM | POA: Diagnosis not present

## 2021-10-25 LAB — ECHOCARDIOGRAM COMPLETE
AR max vel: 1.72 cm2
AV Area VTI: 1.71 cm2
AV Area mean vel: 1.6 cm2
AV Mean grad: 8 mmHg
AV Peak grad: 13.1 mmHg
Ao pk vel: 1.81 m/s
Area-P 1/2: 3.43 cm2
S' Lateral: 2.6 cm

## 2021-10-25 LAB — CBC
HCT: 28.6 % — ABNORMAL LOW (ref 36.0–46.0)
Hemoglobin: 8.9 g/dL — ABNORMAL LOW (ref 12.0–15.0)
MCH: 27.6 pg (ref 26.0–34.0)
MCHC: 31.1 g/dL (ref 30.0–36.0)
MCV: 88.5 fL (ref 80.0–100.0)
Platelets: 361 10*3/uL (ref 150–400)
RBC: 3.23 MIL/uL — ABNORMAL LOW (ref 3.87–5.11)
RDW: 20.4 % — ABNORMAL HIGH (ref 11.5–15.5)
WBC: 10 10*3/uL (ref 4.0–10.5)
nRBC: 0 % (ref 0.0–0.2)

## 2021-10-25 LAB — BASIC METABOLIC PANEL
Anion gap: 8 (ref 5–15)
BUN: 29 mg/dL — ABNORMAL HIGH (ref 8–23)
CO2: 16 mmol/L — ABNORMAL LOW (ref 22–32)
Calcium: 8.1 mg/dL — ABNORMAL LOW (ref 8.9–10.3)
Chloride: 120 mmol/L — ABNORMAL HIGH (ref 98–111)
Creatinine, Ser: 3.09 mg/dL — ABNORMAL HIGH (ref 0.44–1.00)
GFR, Estimated: 14 mL/min — ABNORMAL LOW (ref >60)
Glucose, Bld: 85 mg/dL (ref 70–99)
Potassium: 4.1 mmol/L (ref 3.5–5.1)
Sodium: 144 mmol/L (ref 135–145)

## 2021-10-25 LAB — URINALYSIS, ROUTINE W REFLEX MICROSCOPIC
Bacteria, UA: NONE SEEN
Bilirubin Urine: NEGATIVE
Glucose, UA: NEGATIVE mg/dL
Ketones, ur: NEGATIVE mg/dL
Leukocytes,Ua: NEGATIVE
Nitrite: NEGATIVE
Protein, ur: 100 mg/dL — AB
Specific Gravity, Urine: 1.008 (ref 1.005–1.030)
pH: 5 (ref 5.0–8.0)

## 2021-10-25 LAB — CBG MONITORING, ED
Glucose-Capillary: 105 mg/dL — ABNORMAL HIGH (ref 70–99)
Glucose-Capillary: 108 mg/dL — ABNORMAL HIGH (ref 70–99)
Glucose-Capillary: 78 mg/dL (ref 70–99)
Glucose-Capillary: 91 mg/dL (ref 70–99)
Glucose-Capillary: 97 mg/dL (ref 70–99)

## 2021-10-25 LAB — HEMOGLOBIN AND HEMATOCRIT, BLOOD
HCT: 27.7 % — ABNORMAL LOW (ref 36.0–46.0)
Hemoglobin: 8.7 g/dL — ABNORMAL LOW (ref 12.0–15.0)

## 2021-10-25 LAB — HEMOGLOBIN A1C
Hgb A1c MFr Bld: 4.9 % (ref 4.8–5.6)
Mean Plasma Glucose: 93.93 mg/dL

## 2021-10-25 LAB — GLUCOSE, CAPILLARY
Glucose-Capillary: 86 mg/dL (ref 70–99)
Glucose-Capillary: 82 mg/dL (ref 70–99)

## 2021-10-25 LAB — LACTIC ACID, PLASMA: Lactic Acid, Venous: 0.7 mmol/L (ref 0.5–1.9)

## 2021-10-25 MED ORDER — PANTOPRAZOLE SODIUM 40 MG IV SOLR
40.0000 mg | Freq: Two times a day (BID) | INTRAVENOUS | Status: DC
  Administered 2021-10-25 – 2021-10-26 (×4): 40 mg via INTRAVENOUS
  Filled 2021-10-25 (×5): qty 10

## 2021-10-25 NOTE — Assessment & Plan Note (Signed)
Likely due to hypoglycemia.  Cannot rule out underlying dementia.

## 2021-10-25 NOTE — ED Notes (Signed)
Patient resting in stretcher. Bed locked and lower with bed alarm on. Purewick and brief in place.

## 2021-10-25 NOTE — ED Notes (Signed)
Meal tray placed and bedside. Tray opened and placed in front of patient. Patient not wanting to eat at this time.

## 2021-10-25 NOTE — Progress Notes (Addendum)
  Progress Note   Patient: Courtney Holt UTM:[id number] DOB: [birth year] DOA: 10/24/2021     0 DOS: the patient was seen and examined on 10/25/2021   Brief hospital course: 87 y.o. African-American female with medical history significant for iron deficiency anemia, hypertension and type 2 diabetes mellitus admitted for hypoglycemia  9/27: GI consult, stopping D5   Assessment and Plan: * Hypoglycemia - Due to poor p.o. intake - Stopping glipizide. - Hemoglobin A1c of 4.9.  Do not recommend restarting Glucotrol or other diabetic medicine at discharge -Was started on D5 half-normal saline on admission.  We will stop at this point to monitor her blood sugar  Pleural effusion due to CHF (congestive heart failure) (HCC) - This is in the setting of elevated BNP though without significant symptoms of CHF. - hold off on IV fluids. - continue diuresis with IV Lasix. - Pending 2D echo.  Acute kidney injury superimposed on chronic kidney disease ([place]) - This is acute kidney injury superimposed on stage IV chronic kidney disease. - This is likely prerenal and it could be related to mild acute CHF due to renal hypoperfusion. -Monitor closely  Essential hypertension -continue her antihypertensives.  Diabetes mellitus type 2, controlled, without complications (HCC) - Hemoglobin A1c of 4.9.  Patient does not need any diabetic medicine  Dyslipidemia - continue statin therapy.  History of CVA (cerebrovascular accident) Holding aspirin due to concern for GI bleed  GI bleed Patient had 3 tarry bowel movement with bright red blood from rectum with a drop of hemoglobin from 10.1-8.7 GI consult obtained with Dr. Alice Reichert.  They had discussion with patient's family and conservative management plan.  No luminal evaluation for now We will transfuse if hemoglobin drops less than 7.  Hold aspirin for now.    Acute metabolic encephalopathy Likely due to hypoglycemia.  Cannot rule out underlying  dementia.        Subjective: Pleasantly confused.  Blood sugar was 78 earlier in the ED when I saw her.  Patient had a 3 tarry stool last evening but no further GI bleed noted, family at bedside  Physical Exam: Vitals:   10/25/21 1100 10/25/21 1435 10/25/21 1458 10/25/21 1500  BP: (!) 163/74 (!) 157/68 (!) 140/70 (!) 140/70  Pulse: 87 (!) 102 96 89  Resp: 16 20 (!) 21 (!) 23  Temp:      TempSrc:      SpO2: 91% 95% 27% 40%   87 year old female lying in the bed comfortably without any acute distress Lungs clear to auscultation bilaterally Cardiovascular regular rate and rhythm Abdomen soft, benign Neuro alert and awake, nonfocal Skin no rash or lesion  Data Reviewed:  Creatinine 3.09, hemoglobin 8.7  Family Communication: Daughter and son updated at bedside. Disposition: Status is: Inpatient Remains inpatient appropriate because: Monitoring of blood sugar and hemoglobin   Planned Discharge Destination: Home with Home Health    DVT prophylaxis-heparin subcu Time spent: 35 minutes  Author: Max Sane, MD 10/25/2021 3:40 PM  For on call review [email or web address].

## 2021-10-25 NOTE — Inpatient Diabetes Management (Signed)
Inpatient Diabetes Program Recommendations  AACE/ADA: New Consensus Statement on Inpatient Glycemic Control (2015)  Target Ranges:  Prepandial:   less than 140 mg/dL      Peak postprandial:   less than 180 mg/dL (1-2 hours)      Critically ill patients:  140 - 180 mg/dL   Lab Results  Component Value Date   GLUCAP 105 (H) 10/25/2021   HGBA1C 4.9 10/25/2021    Review of Glycemic Control  Diabetes history: DM2 Outpatient Diabetes medications: Glucotrol 5 mg qd Current orders for Inpatient glycemic control: Novolog 0-9 units tid, 0-5 units hs  Inpatient Diabetes Program Recommendations:   Admitted after fall @ home with initial CBG 34. Noted A1c 4.9 and patient has been on Glucotrol 5 mg @ home. Recommend: -Do not restart Glucotrol or other DM meds @ home -D/C Novolog correction and  continue CBGs only qid  Thank you, Bethena Roys E. Samwise Eckardt, RN, MSN, CDE  Diabetes Coordinator Inpatient Glycemic Control Team Team Pager [phone] (8am-5pm) 10/25/2021 11:11 AM

## 2021-10-25 NOTE — Hospital Course (Addendum)
87 y.o. African-American female with medical history significant for iron deficiency anemia, hypertension and type 2 diabetes mellitus admitted for hypoglycemia  9/27: GI consult, stopping D5 9/28: Family is hoping to transition to hospice as an outpatient after discussion with PCP.  Still poor appetite and confusion

## 2021-10-25 NOTE — Consult Note (Signed)
GI Inpatient Consult Note  Reason for Consult: GI bleed, anemia    Attending Requesting Consult: Raenette Rover, NP  History of Present Illness: Courtney Holt is a 87 y.o. female seen for evaluation of melena/hematochezia concerning for GI bleed at the request of Raenette Rover, NP. Patient has a PMH of HTN, HLD, Hx of CVA, CHF, and T2DM. She presented to the [facility] ED from home yesterday afternoon after being found by family with altered mental status and unresponsiveness. She was reportedly fine around 9AM yesterday morning at her baseline, but then when family came back to check on her ~3PM she was found on the floor with altered mental status and unable to answer basic questions so EMS was called. Upon presentation to the ED, BP 165/90 and RR 22 and otherwise normal vital signs. Labs were significant for mild hypokalemia 3.5, glucose 69, BUN 32, serum creatinine 3.33, lactic acid 1, hemoglobin 10.1, hematocrit 31.8, and influenza/COVID-19 PCR negative. BNP was elevated 645.4. EKG showed sinus tachycardia with rate 102 with multiple PVCs and anteroseptal Q waves. Non-contrasted CT scan head negative for acute abnormalities. CXR showed cardiomegaly with vascular congestion and pleural effusions. Per ED physician, patient had rectal prolapse which was reduced. She was noted to have a total of 3 tarry bowel movements with right red blood overnight. Hemoglobin reduced from 10.1 --> 8.9. GI was consulted for further evaluation and management.   Patient seen and examined resting comfortably this morning in ED stretcher. Son and daughter are bedside. Patient unable to provide any information. Daughter reports patient is extremely hard of hearing. Patient has been more responsive to family this morning and able to have discourse. Since 0700 this morning there have been no further tarry or bloody stools. Per ED nurse, she had small amount of bright red blood on tissue paper when wiping around 0830 this  morning without any stool output. There is no report of abdominal pain, abdominal cramping, nausea, or vomiting. Daughter reports that her mother had a colonoscopy 3 years ago at [facility] which was reportedly normal. Daughter reports the rectal prolapse issue has been known for several years and observation has been advised. There is no report of hx of GI bleeding. No previous endoscopy. Patient is not on blood thinners. There is no frequent NSAID use. Hemoglobin re-checked 0900 this morning was 8.7.     Past Medical History:  Past Medical History:  Diagnosis Date   Hypertension     Problem List: Patient Active Problem List   Diagnosis Date Noted   Hypoglycemia 10/24/2021   Essential hypertension 10/24/2021   Dyslipidemia 10/24/2021   History of CVA (cerebrovascular accident) 10/24/2021   Diabetes mellitus type 2, controlled, without complications ([place]) 17/40/8144   Acute kidney injury superimposed on chronic kidney disease ([place]) 10/24/2021   Pleural effusion due to CHF (congestive heart failure) ([place]) 10/24/2021    Past Surgical History: History reviewed. No pertinent surgical history.  Allergies: Allergies  Allergen Reactions   Penicillins     Home Medications: (Not in a hospital admission)  Home medication reconciliation was completed with the patient.   Scheduled Inpatient Medications:    furosemide  20 mg Intravenous Q12H   heparin injection (subcutaneous)  5,000 Units Subcutaneous Q8H   insulin aspart  0-5 Units Subcutaneous QHS   insulin aspart  0-9 Units Subcutaneous TID WC   potassium chloride  40 mEq Oral Once    Continuous Inpatient Infusions:    dextrose 5 % and 0.45% NaCl 75  mL/hr at 10/25/21 0631    PRN Inpatient Medications:  acetaminophen **OR** acetaminophen, ondansetron **OR** ondansetron (ZOFRAN) IV, polyethylene glycol, traZODone  Family History: family history is not on file.  The patient's family history is negative for inflammatory bowel disorders,  GI malignancy, or solid organ transplantation.  Social History:   reports that she has been smoking. She has never used smokeless tobacco. She reports current alcohol use. She reports that she does not use drugs. The patient denies ETOH, tobacco, or drug use.   Review of Systems:  Unable to obtain due to patient's hearing loss/AMS  Physical Examination: BP (!) 161/92   Pulse (!) 104   Temp 98.7 F (37.1 C) (Oral)   Resp 18   SpO2 92%  Gen: NAD, alert and oriented to person HEENT: PEERLA, EOMI, Neck: supple, no JVD or thyromegaly Chest: CTA bilaterally, no wheezes, crackles, or other adventitious sounds CV: RRR, no m/g/c/r Abd: soft, NT, ND, +BS in all four quadrants; no HSM, guarding, ridigity, or rebound tenderness Ext: no edema, well perfused with 2+ pulses, Skin: no rash or lesions noted Lymph: no LAD  Data: Lab Results  Component Value Date   WBC 10.0 10/25/2021   HGB 8.9 (L) 10/25/2021   HCT 28.6 (L) 10/25/2021   MCV 88.5 10/25/2021   PLT 361 10/25/2021   Recent Labs  Lab 10/24/21 1700 10/25/21 0500  HGB 10.1* 8.9*   Lab Results  Component Value Date   NA 144 10/25/2021   K 4.1 10/25/2021   CL 120 (H) 10/25/2021   CO2 16 (L) 10/25/2021   BUN 29 (H) 10/25/2021   CREATININE 3.09 (H) 10/25/2021   Lab Results  Component Value Date   ALT 12 10/24/2021   AST 27 10/24/2021   ALKPHOS 75 10/24/2021   BILITOT 0.6 10/24/2021   No results for input(s): "APTT", "INR", "PTT" in the last 168 hours.  Assessment/Plan:  87 y/o AA female with a PMH of IDA requiring iron infusions prn, HTN, and T2DM presented to the [facility] ED via EMS from home yesterday afternoon after being found with altered mental status and unresponsiveness by family at home. Work-up in ED revealed hypoglycemia, pleural effusions, AKI superimposed on CKD Stage III, and blood loss anemia. GI consulted in context of tarry stools with bright red blood and drop in hemoglobin 10.1-->8.7.    GI bleed -  report of 3 tarry bowel movements with bright red blood with drop in hemoglobin from 10.1-->8.9-->8.7. Differential includes peptic ulcer disease +/- H pylori, gastritis, Mallory-Weiss tear, AVMs, Dieulafoy's lesion, duodenitis, erosive esophagitis, less likely LGI source from polyp or mass lesion. H&H stable currently with no overt gastrointestinal blood loss.   Blood loss anemia - see #1  Altered mental status - improving, likely in setting of hypoglycemia and AKI  Hypoglycemia - resolved   Pleural effusions 2/2 CHF - echo today pending   AKI superimposed on CKD Stage III  T2DM  Hx of CVA  Recommendations:  - H&H stable currently with no overt hemodynamic instability or overt signs of bleeding - Continue to monitor H&H closely and transfuse for Hgb <7.0 - Start PPI IV BID for gastric protection - Will check H pylori stool antigen  - Continue management of comorbidities per primary team - Continue serial abdominal examinations and monitor for signs of blood loss - Discussed with son and daughter bedside about potential GI evaluation with EGD +/- CSY versus expectant management and observation. Daughter is reluctant to proceed with invasive evaluation  and would like to continue to observe clinically which is reasonable. Risks of anesthesia without overt signs of bleeding likely outweigh potential benefits.  - If she develops signs of overt GIB, can consider CT angiography versus NM GI blood loss bleeding scan - If there are signs of ongoing bleeding or hemodynamic changes, can consider diagnostic EGD - NO plans for luminal evaluation at this time. Continue expectant management.  - GI will follow along peripherally  - OK to continue clears for now and advance as tolerated   Thank you for the consult. Please call with questions or concerns.  Geanie Kenning, PA-C [facility] [phone]

## 2021-10-25 NOTE — Assessment & Plan Note (Addendum)
Patient had 3 tarry bowel movement with bright red blood from rectum with a drop of hemoglobin from 10.1-8.7 GI consult obtained with Dr. Alice Reichert.  They had discussion with patient's family and conservative management plan.  No luminal evaluation for now We will transfuse if hemoglobin drops less than 7.  Hold aspirin for now.

## 2021-10-25 NOTE — ED Notes (Signed)
CBG recheck was 61. Pt currently vomiting. Given PRN zofran. Following Hypoglycemia Algorithm CBG 55-69, pt unable to take POs, 12.5 gm D50 IV was given. See MAR. Repeat CBG 108. IV dextrose 5% - 0.45% NS at 75, continues infusing.

## 2021-10-25 NOTE — ED Notes (Signed)
Patient assisted to restroom with help of this RN. Patient did not have BM but small amount of red blood noted when wiping. Manuella Ghazi, MD aware. Family at bedside. Bed locked and lowered. Bed alarm on.

## 2021-10-25 NOTE — ED Notes (Signed)
Pt pulled out IV was found at the edge of the bed. Has a third large dark tarry stool with bright red blood. Pt orientation has improved. Was re-assisted to bed. New IV obtained. Call bell within reach. Bed alarm on. Door open for viability.

## 2021-10-25 NOTE — Progress Notes (Signed)
*  PRELIMINARY RESULTS* Echocardiogram 2D Echocardiogram has been performed.  Courtney Holt 10/25/2021, 11:30 AM

## 2021-10-25 NOTE — Progress Notes (Addendum)
       CROSS COVER NOTE  NAME: Courtney Holt MRN: [id number] DOB : 11-09-1934    Date of Service   10/25/2021   HPI/Events of Note    Patient with prolapsed rectal tissue had 2 tarry stools with bright red blood at 0200. Hgb change from 10.1 --> 8.9. No other bleeding reported by RN.   Overall, patient has had a total of 3 Tarry stools with bright red blood. VSS. Follow up H&H ordered for 0900. GI consult placed.    Interventions/ Plan   H& H follow up GI consult placed. Staff message sent to Dr. Alice Reichert.        Raenette Rover, DNP, [place]

## 2021-10-25 NOTE — Progress Notes (Signed)
Palliative:  Consult received and chart reviewed. Visited patient in ED. Niece is at bedside. She reports patient has been confused. She tells me daughter went to eat. Attempted to call daughter but no answer - voice mail left with call back number.  Juel Burrow, DNP, AGNP-C Palliative Medicine Team Team Phone # [phone]  Pager # [phone]  NO CHARGE

## 2021-10-26 ENCOUNTER — Inpatient Hospital Stay: Payer: Medicare Other

## 2021-10-26 DIAGNOSIS — E162 Hypoglycemia, unspecified: Secondary | ICD-10-CM | POA: Diagnosis not present

## 2021-10-26 DIAGNOSIS — Z7189 Other specified counseling: Secondary | ICD-10-CM | POA: Diagnosis not present

## 2021-10-26 DIAGNOSIS — I509 Heart failure, unspecified: Secondary | ICD-10-CM | POA: Diagnosis not present

## 2021-10-26 DIAGNOSIS — I10 Essential (primary) hypertension: Secondary | ICD-10-CM | POA: Diagnosis not present

## 2021-10-26 DIAGNOSIS — Z515 Encounter for palliative care: Secondary | ICD-10-CM

## 2021-10-26 DIAGNOSIS — N179 Acute kidney failure, unspecified: Secondary | ICD-10-CM | POA: Diagnosis not present

## 2021-10-26 DIAGNOSIS — G9341 Metabolic encephalopathy: Secondary | ICD-10-CM

## 2021-10-26 LAB — BASIC METABOLIC PANEL
Anion gap: 7 (ref 5–15)
BUN: 28 mg/dL — ABNORMAL HIGH (ref 8–23)
CO2: 18 mmol/L — ABNORMAL LOW (ref 22–32)
Calcium: 7.9 mg/dL — ABNORMAL LOW (ref 8.9–10.3)
Chloride: 119 mmol/L — ABNORMAL HIGH (ref 98–111)
Creatinine, Ser: 3.38 mg/dL — ABNORMAL HIGH (ref 0.44–1.00)
GFR, Estimated: 13 mL/min — ABNORMAL LOW (ref >60)
Glucose, Bld: 70 mg/dL (ref 70–99)
Potassium: 3.3 mmol/L — ABNORMAL LOW (ref 3.5–5.1)
Sodium: 144 mmol/L (ref 135–145)

## 2021-10-26 LAB — CBC
HCT: 27.7 % — ABNORMAL LOW (ref 36.0–46.0)
Hemoglobin: 8.8 g/dL — ABNORMAL LOW (ref 12.0–15.0)
MCH: 27.7 pg (ref 26.0–34.0)
MCHC: 31.8 g/dL (ref 30.0–36.0)
MCV: 87.1 fL (ref 80.0–100.0)
Platelets: 337 10*3/uL (ref 150–400)
RBC: 3.18 MIL/uL — ABNORMAL LOW (ref 3.87–5.11)
RDW: 20.1 % — ABNORMAL HIGH (ref 11.5–15.5)
WBC: 9.2 10*3/uL (ref 4.0–10.5)
nRBC: 0 % (ref 0.0–0.2)

## 2021-10-26 LAB — GLUCOSE, CAPILLARY
Glucose-Capillary: 112 mg/dL — ABNORMAL HIGH (ref 70–99)
Glucose-Capillary: 62 mg/dL — ABNORMAL LOW (ref 70–99)
Glucose-Capillary: 85 mg/dL (ref 70–99)
Glucose-Capillary: 86 mg/dL (ref 70–99)
Glucose-Capillary: 129 mg/dL — ABNORMAL HIGH (ref 70–99)

## 2021-10-26 MED ORDER — POTASSIUM CHLORIDE CRYS ER 20 MEQ PO TBCR
40.0000 meq | EXTENDED_RELEASE_TABLET | Freq: Once | ORAL | Status: DC
  Filled 2021-10-26: qty 2

## 2021-10-26 MED ORDER — SODIUM CHLORIDE 0.9 % IV SOLN: INTRAVENOUS | Status: DC

## 2021-10-26 MED ORDER — DEXTROSE 50 % IV SOLN
12.5000 g | INTRAVENOUS | Status: AC
  Administered 2021-10-26: 12.5 g via INTRAVENOUS
  Filled 2021-10-26: qty 50

## 2021-10-26 NOTE — Assessment & Plan Note (Signed)
Holding aspirin due to concern for GI bleed.

## 2021-10-26 NOTE — Consult Note (Signed)
[facility]  CONSULT NOTE    Date: 10/26/2021                  Patient Name:  Courtney Holt  MRN: [id number]  DOB: Jun 10, 1934  Age / Sex: 87 y.o., female         PCP: [facility] [place]                 Service Requesting Consult: [facility]                 Reason for Consult: Acute kidney injury            History of Present Illness: Ms. Courtney Holt is a 87 y.o.  female with past medical history including hypertension, type 2 diabetes, and iron deficiency anemia, who was admitted to [facility] on 10/24/2021 for Hypoglycemia [E16.2] Altered mental status, unspecified altered mental status type [A63.01] Acute metabolic encephalopathy [S01.09]   Patient presents to the emergency department with decreased glucose levels.  Patient seen resting in bed with son at bedside.  Patient has lost majority of hearing, interview conducted with her son.  Son states patient lives home alone and has had poor appetite for few weeks.  Denies any previous knowledge of kidney concerns.  States she has a primary care physician but does not follow nephrology.  To his knowledge, patient does not take NSAIDs.  Labs on ED arrival include sodium 117, glucose 69, serum bicarb 19, BUN 32, creatinine 3.33 with GFR 13, BNP 645, and hemoglobin 10.1.Respiratory panel negative for influenza and COVID-19.  Urinalysis has moderate hematuria. Urine culture negative. Chest xray shows vascular congestion and pleural effusions.  Echo performed on 10/25/2021 shows EF 55 to 60% with a grade 1 diastolic dysfunction and aortic valve calcification without stenosis.  Medications: Outpatient medications: Medications Prior to Admission  Medication Sig Dispense Refill Last Dose   amLODipine (NORVASC) 10 MG tablet Take 10 mg by mouth daily.   Past Week   aspirin EC 81 MG tablet Take 81 mg by mouth daily.   Past Week   atorvastatin (LIPITOR) 40 MG tablet Take 40 mg by mouth daily.   Past Week   carvedilol  (COREG) 12.5 MG tablet Take 12.5 mg by mouth 2 (two) times daily.   Past Week   glipiZIDE (GLUCOTROL) 5 MG tablet Take 5 mg by mouth daily.   Past Week   hydrochlorothiazide (HYDRODIURIL) 25 MG tablet Take 25 mg by mouth daily.   Past Week    Current medications: Current Facility-Administered Medications  Medication Dose Route Frequency Provider Last Rate Last Admin   0.9 %  sodium chloride infusion   Intravenous Continuous Max Sane, MD 75 mL/hr at 10/26/21 1653 Infusion Verify at 10/26/21 1653   acetaminophen (TYLENOL) tablet 650 mg  650 mg Oral Q6H PRN Mansy, Jan A, MD   650 mg at 10/25/21 2049   Or   acetaminophen (TYLENOL) suppository 650 mg  650 mg Rectal Q6H PRN Mansy, Jan A, MD       heparin injection 5,000 Units  5,000 Units Subcutaneous Q8H Lorna Dibble, RPH   5,000 Units at 10/26/21 1633   ondansetron (ZOFRAN) tablet 4 mg  4 mg Oral Q6H PRN Mansy, Jan A, MD       Or   ondansetron [facility]) injection 4 mg  4 mg Intravenous Q6H PRN Mansy, Jan A, MD   4 mg at 10/25/21 0631   pantoprazole (PROTONIX) injection 40 mg  40 mg Intravenous Q12H Gerarda Gunther M, [place]   40 mg at 10/26/21 1056   polyethylene glycol (MIRALAX / GLYCOLAX) packet 17 g  17 g Oral Daily PRN Beers, Shanon Brow, RPH       potassium chloride (KLOR-CON) packet 40 mEq  40 mEq Oral Once Mansy, Jan A, MD       potassium chloride SA (KLOR-CON M) CR tablet 40 mEq  40 mEq Oral Once Max Sane, MD       traZODone (DESYREL) tablet 25 mg  25 mg Oral QHS PRN Mansy, Jan A, MD   25 mg at 10/25/21 2049      Allergies: Allergies  Allergen Reactions   Penicillins       Past Medical History: Past Medical History:  Diagnosis Date   Hypertension      Past Surgical History: History reviewed. No pertinent surgical history.   Family History: History reviewed. No pertinent family history.   Social History: Social History   Socioeconomic History   Marital status: Divorced    Spouse name: Not on file   Number  of children: Not on file   Years of education: Not on file   Highest education level: Not on file  Occupational History   Not on file  Tobacco Use   Smoking status: Every Day   Smokeless tobacco: Never  Substance and Sexual Activity   Alcohol use: Yes   Drug use: No   Sexual activity: Not on file  Other Topics Concern   Not on file  Social History Narrative   Not on file   Social Determinants of Health   Financial Resource Strain: Not on file  Food Insecurity: Not on file  Transportation Needs: Not on file  Physical Activity: Not on file  Stress: Not on file  Social Connections: Not on file  Intimate Partner Violence: Not on file     Review of Systems: Review of Systems  Unable to perform ROS: Other (Patient deaf)    Vital Signs: Blood pressure (!) 141/89, pulse 89, temperature 98.6 F (37 C), temperature source Oral, resp. rate 16, weight 50.1 kg, SpO2 97 %.  Weight trends: Filed Weights   10/26/21 0500  Weight: 50.1 kg    Physical Exam: General: NAD  Head: Normocephalic, atraumatic.  Dry oral mucosal membranes  Eyes: Anicteric  Neck: Supple  Lungs:  Clear to auscultation, normal effort, room air  Heart: Regular rate and rhythm  Abdomen:  Soft, nontender, nondistended  Extremities: , No peripheral edema.  Neurologic: Nonfocal, moving all four extremities  Skin: No lesions  Access: None     Lab results: Basic Metabolic Panel: Recent Labs  Lab 10/24/21 1700 10/25/21 0500 10/26/21 0602  NA 144 144 144  K 3.5 4.1 3.3*  CL 117* 120* 119*  CO2 19* 16* 18*  GLUCOSE 69* 85 70  BUN 32* 29* 28*  CREATININE 3.33* 3.09* 3.38*  CALCIUM 8.9 8.1* 7.9*    Liver Function Tests: Recent Labs  Lab 10/24/21 1700  AST 27  ALT 12  ALKPHOS 75  BILITOT 0.6  PROT 6.6  ALBUMIN 3.1*   No results for input(s): "LIPASE", "AMYLASE" in the last 168 hours. No results for input(s): "AMMONIA" in the last 168 hours.  CBC: Recent Labs  Lab 10/24/21 1700  10/25/21 0500 10/25/21 0933 10/26/21 0602  WBC 7.7 10.0  --  9.2  NEUTROABS 6.1  --   --   --   HGB 10.1* 8.9* 8.7* 8.8*  HCT  31.8* 28.6* 27.7* 27.7*  MCV 87.1 88.5  --  87.1  PLT 380 361  --  337    Cardiac Enzymes: Recent Labs  Lab 10/24/21 1700  CKTOTAL 228    BNP: Invalid input(s): "POCBNP"  CBG: Recent Labs  Lab 10/25/21 2100 10/26/21 0753 10/26/21 0822 10/26/21 1153 10/26/21 1632  GLUCAP 82 62* 112* 85 5    Microbiology: Results for orders placed or performed during the hospital encounter of 10/24/21  Culture, blood (routine x 2)     Status: None (Preliminary result)   Collection Time: 10/24/21  5:00 PM   Specimen: BLOOD  Result Value Ref Range Status   Specimen Description BLOOD LEFT ANTECUBITAL  Final   Special Requests   Final    BOTTLES DRAWN AEROBIC AND ANAEROBIC Blood Culture results may not be optimal due to an excessive volume of blood received in culture bottles   Culture   Final    NO GROWTH 2 DAYS Performed at [facility], [address], [place], [place] [address]    Report Status PENDING  Incomplete  Resp Panel by RT-PCR (Flu A&B, Covid) Anterior Nasal Swab     Status: None   Collection Time: 10/24/21  5:00 PM   Specimen: Anterior Nasal Swab  Result Value Ref Range Status   SARS Coronavirus 2 by RT PCR NEGATIVE NEGATIVE Final    Comment: (NOTE) SARS-CoV-2 target nucleic acids are NOT DETECTED.  The SARS-CoV-2 RNA is generally detectable in upper respiratory specimens during the acute phase of infection. The lowest concentration of SARS-CoV-2 viral copies this assay can detect is 138 copies/mL. A negative result does not preclude SARS-Cov-2 infection and should not be used as the sole basis for treatment or other patient management decisions. A negative result may occur with  improper specimen collection/handling, submission of specimen other than nasopharyngeal swab, presence of viral mutation(s) within the areas targeted  by this assay, and inadequate number of viral copies(<138 copies/mL). A negative result must be combined with clinical observations, patient history, and epidemiological information. The expected result is Negative.  Fact Sheet for Patients:  EntrepreneurPulse.com.au  Fact Sheet for Healthcare Providers:  IncredibleEmployment.be  This test is no t yet approved or cleared by the [place] FDA and  has been authorized for detection and/or diagnosis of SARS-CoV-2 by FDA under an Emergency Use Authorization (EUA). This EUA will remain  in effect (meaning this test can be used) for the duration of the COVID-19 declaration under Section 564(b)(1) of the Act, 21 U.S.C.section 360bbb-3(b)(1), unless the authorization is terminated  or revoked sooner.       Influenza A by PCR NEGATIVE NEGATIVE Final   Influenza B by PCR NEGATIVE NEGATIVE Final    Comment: (NOTE) The Xpert Xpress SARS-CoV-2/FLU/RSV plus assay is intended as an aid in the diagnosis of influenza from Nasopharyngeal swab specimens and should not be used as a sole basis for treatment. Nasal washings and aspirates are unacceptable for Xpert Xpress SARS-CoV-2/FLU/RSV testing.  Fact Sheet for Patients: EntrepreneurPulse.com.au  Fact Sheet for Healthcare Providers: IncredibleEmployment.be  This test is not yet approved or cleared by the [place] FDA and has been authorized for detection and/or diagnosis of SARS-CoV-2 by FDA under an Emergency Use Authorization (EUA). This EUA will remain in effect (meaning this test can be used) for the duration of the COVID-19 declaration under Section 564(b)(1) of the Act, 21 U.S.C. section 360bbb-3(b)(1), unless the authorization is terminated or revoked.  Performed at [facility],  [place], [place] [address]   Culture, blood (routine x 2)     Status: None (Preliminary result)    Collection Time: 10/24/21  6:00 PM   Specimen: BLOOD  Result Value Ref Range Status   Specimen Description BLOOD LEFT ANTECUBITAL  Final   Special Requests   Final    BOTTLES DRAWN AEROBIC AND ANAEROBIC Blood Culture results may not be optimal due to an excessive volume of blood received in culture bottles   Culture   Final    NO GROWTH 2 DAYS Performed at [facility], [address] [place],  [address]    Report Status PENDING  Incomplete    Coagulation Studies: No results for input(s): "LABPROT", "INR" in the last 72 hours.  Urinalysis: Recent Labs    10/24/21 2351  COLORURINE STRAW*  LABSPEC 1.008  PHURINE 5.0  GLUCOSEU NEGATIVE  HGBUR MODERATE*  BILIRUBINUR NEGATIVE  KETONESUR NEGATIVE  PROTEINUR 100*  NITRITE NEGATIVE  LEUKOCYTESUR NEGATIVE      Imaging: US RENAL  Result Date: 10/26/2021 CLINICAL DATA:  Acute kidney injury, history hypertension EXAM: RENAL / URINARY TRACT ULTRASOUND COMPLETE COMPARISON:  None Available. FINDINGS: Right Kidney: Renal measurements: 8.0 x 4.4 x 3.9 cm = volume: 72 mL. Cortical thinning. Increased cortical echogenicity. Complicated cyst mid inferior pole 11 x 10 x 10 mm containing a small septation. No additional mass, hydronephrosis, or shadowing calcification. Left Kidney: Renal measurements: 9.3 x 5.1 x 4.3 cm = volume: 105 mL. Cortical thinning. Increased cortical echogenicity. Cyst at upper pole medially 21 x 19 x 18 mm. No additional mass, hydronephrosis, or shadowing calcification. Bladder: Mildly trabeculated bladder without mass. Other: Small amount of free pelvic fluid. Complicated cystic lesion identified in pelvis 7.4 x 5.7 x 5.2 cm, question complicated cystic ovarian neoplasm or other complex cystic lesion; dedicated pelvic sonography or CT imaging recommended for further assessment. IMPRESSION: Cortical atrophy and medical renal disease changes of both kidneys. Simple cyst LEFT kidney 2.1 cm diameter; no  follow-up imaging recommended. Minimally complicated cyst LEFT kidney 1.1 cm diameter, containing a thin partial septation; no follow-up imaging recommended. Complex cystic lesion within pelvis 7.4 x 5.7 x 5.2 cm cannot exclude ovarian neoplasm; dedicated pelvic sonography or CT imaging recommended. Electronically Signed   By: Lavonia Dana M.D.   On: 10/26/2021 15:42   ECHOCARDIOGRAM COMPLETE  Result Date: 10/25/2021    ECHOCARDIOGRAM REPORT   Patient Name:   JALAILA CARADONNA Date of Exam: 10/25/2021 Medical Rec #:  [id number]      Height:       60.0 in Accession #:    [id number]     Weight:       101.0 lb Date of Birth:  1934-02-16      BSA:          1.396 m Patient Age:    72 years       BP:           163/74 mmHg Patient Gender: F              HR:           104 bpm. Exam Location:  [facility] Procedure: 2D Echo, Color Doppler and Cardiac Doppler Indications:     I50.31 congestive heart failure-Acute Diastolic  History:         Patient has no prior history of Echocardiogram examinations.                  CKD;  Risk Factors:Hypertension.  Sonographer:     Charmayne Sheer Referring Phys:  [id number] JAN A MANSY Diagnosing Phys: Kate Sable MD  Sonographer Comments: No subcostal window. IMPRESSIONS  1. Left ventricular ejection fraction, by estimation, is 55 to 60%. The left ventricle has normal function. The left ventricle has no regional wall motion abnormalities. There is mild left ventricular hypertrophy. Left ventricular diastolic parameters are consistent with Grade I diastolic dysfunction (impaired relaxation).  2. Right ventricular systolic function is normal. The right ventricular size is normal. Mildly increased right ventricular wall thickness.  3. Moderate pericardial effusion. The pericardial effusion is circumferential. There is no evidence of cardiac tamponade.  4. The mitral valve is grossly normal. Mild mitral valve regurgitation.  5. The aortic valve was not well visualized. Aortic valve regurgitation is not  visualized. Aortic valve sclerosis/calcification is present, without any evidence of aortic stenosis. FINDINGS  Left Ventricle: Left ventricular ejection fraction, by estimation, is 55 to 60%. The left ventricle has normal function. The left ventricle has no regional wall motion abnormalities. The left ventricular internal cavity size was normal in size. There is  mild left ventricular hypertrophy. Left ventricular diastolic parameters are consistent with Grade I diastolic dysfunction (impaired relaxation). Right Ventricle: The right ventricular size is normal. Mildly increased right ventricular wall thickness. Right ventricular systolic function is normal. Left Atrium: Left atrial size was normal in size. Right Atrium: Right atrial size was normal in size. Pericardium: A moderately sized pericardial effusion is present. The pericardial effusion is circumferential. There is no evidence of cardiac tamponade. Mitral Valve: The mitral valve is grossly normal. Mild mitral valve regurgitation. Tricuspid Valve: The tricuspid valve is normal in structure. Tricuspid valve regurgitation is not demonstrated. Aortic Valve: The aortic valve was not well visualized. Aortic valve regurgitation is not visualized. Aortic valve sclerosis/calcification is present, without any evidence of aortic stenosis. Aortic valve mean gradient measures 8.0 mmHg. Aortic valve peak gradient measures 13.1 mmHg. Aortic valve area, by VTI measures 1.71 cm. Pulmonic Valve: The pulmonic valve was normal in structure. Pulmonic valve regurgitation is trivial. Aorta: The aortic root and ascending aorta are structurally normal, with no evidence of dilitation. Venous: The inferior vena cava was not well visualized. IAS/Shunts: No atrial level shunt detected by color flow Doppler.  LEFT VENTRICLE PLAX 2D LVIDd:         3.80 cm   Diastology LVIDs:         2.60 cm   LV e' medial:    3.37 cm/s LV PW:         0.90 cm   LV E/e' medial:  37.3 LV IVS:        0.80  cm   LV e' lateral:   13.30 cm/s LVOT diam:     1.80 cm   LV E/e' lateral: 9.5 LV SV:         58 LV SV Index:   42 LVOT Area:     2.54 cm  RIGHT VENTRICLE RV Basal diam:  2.30 cm RV S prime:     20.00 cm/s LEFT ATRIUM             Index        RIGHT ATRIUM          Index LA diam:        3.40 cm 2.44 cm/m   RA Area:     8.32 cm LA Vol (A2C):   35.3 ml 25.28 ml/m  RA Volume:   15.30  ml 10.96 ml/m LA Vol (A4C):   31.5 ml 22.56 ml/m LA Biplane Vol: 35.0 ml 25.07 ml/m  AORTIC VALVE                     PULMONIC VALVE AV Area (Vmax):    1.72 cm      PV Vmax:       1.27 m/s AV Area (Vmean):   1.60 cm      PV Peak grad:  6.5 mmHg AV Area (VTI):     1.71 cm AV Vmax:           181.00 cm/s AV Vmean:          131.000 cm/s AV VTI:            0.340 m AV Peak Grad:      13.1 mmHg AV Mean Grad:      8.0 mmHg LVOT Vmax:         122.00 cm/s LVOT Vmean:        82.300 cm/s LVOT VTI:          0.228 m LVOT/AV VTI ratio: 0.67  AORTA Ao Root diam: 2.30 cm MITRAL VALVE MV Area (PHT): 3.43 cm     SHUNTS MV Decel Time: 221 msec     Systemic VTI:  0.23 m MV E velocity: 125.70 cm/s  Systemic Diam: 1.80 cm MV A velocity: 159.00 cm/s MV E/A ratio:  0.79 Kate Sable MD Electronically signed by Kate Sable MD Signature Date/Time: 10/25/2021/5:00:54 PM    Final    CT Head Wo Contrast  Result Date: 10/24/2021 CLINICAL DATA:  Altered mental status. EXAM: CT HEAD WITHOUT CONTRAST TECHNIQUE: Contiguous axial images were obtained from the base of the skull through the vertex without intravenous contrast. RADIATION DOSE REDUCTION: This exam was performed according to the departmental dose-optimization program which includes automated exposure control, adjustment of the mA and/or kV according to patient size and/or use of iterative reconstruction technique. COMPARISON:  None Available. FINDINGS: Brain: There is moderate severity cerebral atrophy with widening of the extra-axial spaces and ventricular dilatation. There are areas of  decreased attenuation within the white matter tracts of the supratentorial brain, consistent with microvascular disease changes. 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 Small, bilateral basal ganglia lacunar infarcts are noted. Vascular: No hyperdense vessel or unexpected calcification. Skull: Normal. Negative for fracture or focal lesion. Sinuses/Orbits: No acute finding. Other: None. IMPRESSION: 1. Generalized cerebral atrophy and chronic white matter small vessel ischemic changes without evidence of an acute intracranial abnormality. 2. 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 3. Small, bilateral basal ganglia lacunar infarcts. Electronically Signed   By: Virgina Norfolk M.D.   On: 10/24/2021 17:42     Assessment & Plan: Ms. Kimberlee Shoun is a 87 y.o.  female with past medical history including hypertension, type 2 diabetes, and iron deficiency anemia, who was admitted to [facility] on 10/24/2021 for Hypoglycemia [E16.2] Altered mental status, unspecified altered mental status type [E75.17] Acute metabolic encephalopathy [G01.74]  1 Acute kidney injury with chronic kidney disease stage IV. Recent baseline unknown, Creatinine 2 with GFR 26 in 2019. Renal ultrasound positive for simple cyst without hydronephrosis.  No recent IV contrast exposure or illness.  Acute kidney injury likely secondary to hypovolemia from poor oral intake and continued diuretic use.  Continue to hold hydrochlorothiazide.  Agree with ordered IV fluids for rehydration.  No acute need for dialysis at this time, the patient  would make a poor long-term dialysis patient.  We will monitor renal functions with IV fluid.  Patient encouraged to increase oral intake.  Recommend consulting palliative care to determine goals of care.  2. Anemia of chronic kidney disease Lab Results  Component Value Date   HGB 8.8 (L) 10/26/2021    Hemoglobin below desired target.  Recent episodes  of tarry stools reported.  GI consult placed with conservative management plan discussed.  3. Secondary Hyperparathyroidism:  Lab Results  Component Value Date   CALCIUM 7.9 (L) 10/26/2021    Calcium below desired target.  We will continue to monitor.  We will offer ensure supplements between meals.  4.  Hypertension with chronic kidney disease.  Home regimen includes amlodipine, carvedilol, and hydrochlorothiazide.  All currently held.  Current blood pressure 135/72.   LOS: 1   9/28/20235:26 PM

## 2021-10-26 NOTE — Progress Notes (Signed)
Mobility Specialist - Progress Note   10/26/21 1400  Mobility  Activity Off unit     Pt preparing for off-unit transfer for [place] upon arrival. Will attempt another date/time.    Kathee Delton Mobility Specialist 10/26/21, 2:50 PM

## 2021-10-26 NOTE — Assessment & Plan Note (Signed)
continue her antihypertensives. 

## 2021-10-26 NOTE — TOC Progression Note (Signed)
Transition of Care [facility]) - Progression Note    Patient Details  Name: Courtney Holt MRN: [id number] Date of Birth: Jul 28, 1934  Transition of [facility]) CM/SW Contact  Beverly Sessions, RN Phone Number: 10/26/2021, 3:47 PM  Clinical Narrative:      Per Sonia Baller with [facility] family would like to follow up with PCP before moving forward with hospice services  Son at bed side.  Patient currently off the floor.  Discussed option of home health services.  He states he will follow up with patient's daughter and call me directly with their decision.  Daughter to transport at discharge       Expected Discharge Plan and Services                                                 Social Determinants of Health (SDOH) Interventions    Readmission Risk Interventions     No data to display

## 2021-10-26 NOTE — Assessment & Plan Note (Signed)
Likely due to hypoglycemia.  Cannot rule out underlying dementia with behavioral disturbances

## 2021-10-26 NOTE — Assessment & Plan Note (Signed)
Patient had 3 tarry bowel movement with bright red blood from rectum with a drop of hemoglobin from 10.1-8.7 GI consult obtained with Dr. Alice Reichert.  They had discussion with patient's family and conservative management plan.  No luminal evaluation for now We will transfuse if hemoglobin drops less than 7.  Hold aspirin for now.

## 2021-10-26 NOTE — Assessment & Plan Note (Addendum)
-   Likely due to diastolic CHF, will hold Lasix considering worsening kidney function -  2D echo shows grade 1 diastolic dysfunction.  Normal LV systolic function Net IO Since Admission: 1,650 mL [10/26/21 1356]

## 2021-10-26 NOTE — TOC Initial Note (Signed)
Transition of Care [facility]) - Initial/Assessment Note    Patient Details  Name: Courtney Holt MRN: [id number] Date of Birth: 28-Jul-1934  Transition [facility] Care [facility]) CM/SW Contact:    Beverly Sessions, RN Phone Number: 10/26/2021, 10:15 AM  Clinical Narrative:                  MD has made referral to Sonia Baller with [employer] for home with hospice referral [place] failure navigator notified of heart failure consult        Patient Goals and CMS Choice        Expected Discharge Plan and Services                                                Prior Living Arrangements/Services                       Activities of Daily Living Home Assistive Devices/Equipment: None ADL Screening (condition at time of admission) Patient's cognitive ability adequate to safely complete daily activities?: No Is the patient deaf or have difficulty hearing?: No Does the patient have difficulty seeing, even when wearing glasses/contacts?: No Does the patient have difficulty concentrating, remembering, or making decisions?: Yes Patient able to express need for assistance with ADLs?: No Does the patient have difficulty dressing or bathing?: Yes Independently performs ADLs?: No Does the patient have difficulty walking or climbing stairs?: No Weakness of Legs: None Weakness of Arms/Hands: None  Permission Sought/Granted                  Emotional Assessment              Admission diagnosis:  Hypoglycemia [E16.2] Altered mental status, unspecified altered mental status type [S28.31] Acute metabolic encephalopathy [D17.61] Patient Active Problem List   Diagnosis Date Noted   Acute metabolic encephalopathy 60/73/7106   GI bleed 10/25/2021   Hypoglycemia 10/24/2021   Essential hypertension 10/24/2021   Dyslipidemia 10/24/2021   History of CVA (cerebrovascular accident) 10/24/2021   Diabetes mellitus type 2, controlled, without complications ([place])  26/94/8546   Acute kidney injury superimposed on chronic kidney disease ([place]) 10/24/2021   Pleural effusion due to CHF (congestive heart failure) ([place]) 10/24/2021   PCP:  [facility], [place]:   [address] [place] [address] Phone: [phone] Fax: [phone]     Social Determinants of Health (SDOH) Interventions    Readmission Risk Interventions     No data to display

## 2021-10-26 NOTE — Assessment & Plan Note (Signed)
-   Hemoglobin A1c of 4.9.  Patient does not need any diabetic medicine.

## 2021-10-26 NOTE — Assessment & Plan Note (Signed)
-   continue statin therapy. 

## 2021-10-26 NOTE — Consult Note (Signed)
Consultation Note Date: 10/26/2021   Patient Name: Courtney Holt  DOB: [birth year]  MRN: [id number]  Age / Sex: 87 y.o., female  PCP: [facility] [place] Referring Physician: Max Sane, MD  Reason for Consultation: Establishing goals of care  HPI/Patient Profile: 87 y.o. female  with past medical history of essential anemia, hypertension and type 2 diabetes mellitus admitted on 10/24/2021 with AMS.  Patient found to be hypoglycemic initially required IV dextrose but now off of dextrose and sugars maintaining.  Patient also found to have pleural effusion due to CHF.  Patient with AKI and Lasix being held.  Patient with GI bleed as well.  No plan for GI work-up.  Hemoglobin staying above 7.  PMD consulted to discuss goals of care.  Clinical Assessment and Goals of Care: I have reviewed medical records including EPIC notes, labs and imaging, assessed the patient and then met with patient's son Simona Huh to discuss diagnosis prognosis, [place], EOL wishes, disposition and options.  I introduced Palliative Medicine as specialized medical care for people living with serious illness. It focuses on providing relief from the symptoms and stress of a serious illness. The goal is to improve quality of life for both the patient and the family.  Simona Huh tells me prior to admission patient was living alone and fully functional.  He tells me she could independently care for herself.  He tells me her cognition was intact.  He tells me she would sometimes be confused but he attributes this to being hard of hearing.  He tells me her appetite would wax and wane.   We discussed patient's current illness and what it means in the larger context of patient's on-going co-morbidities.  Natural disease trajectory and expectations at EOL were discussed.  We discussed her GI bleed.  We discussed AKI.  We discussed AMS.  I attempted to elicit values and goals of care important  to the patient.    The difference between aggressive medical intervention and comfort care was considered in light of the patient's goals of care.   We discussed CODE STATUS and son confirms family all agrees they want patient to remain full code.  Discussed with son the importance of continued conversation with family and the medical providers regarding overall plan of care and treatment options, ensuring decisions are within the context of the patients values and GOCs.    Hospice and Palliative Care services outpatient were explained and offered.  Son tells me they have already been connected to hospice services.  We discussed that goals of care conversations can continue outpatient with hospice services.  Questions and concerns were addressed. The family was encouraged to call with questions or concerns.  Primary Decision Maker NEXT OF KIN -son and daughter    SUMMARY OF RECOMMENDATIONS   Son requests continued full CODE STATUS Per TOC note family being connected with hospice services Goals of care conversations to continue outpatient     Primary Diagnoses: Present on Admission:  Hypoglycemia  Essential hypertension  Dyslipidemia  Acute kidney injury superimposed on chronic kidney disease ([place])  Acute metabolic encephalopathy  GI bleed   I have reviewed the medical record, interviewed the patient and family, and examined the patient. The following aspects are pertinent.  Past Medical History:  Diagnosis Date   Hypertension    Social History   Socioeconomic History   Marital status: Divorced    Spouse name: Not on file   Number of children: Not on file   Years of  education: Not on file   Highest education level: Not on file  Occupational History   Not on file  Tobacco Use   Smoking status: Every Day   Smokeless tobacco: Never  Substance and Sexual Activity   Alcohol use: Yes   Drug use: No   Sexual activity: Not on file  Other Topics Concern   Not on file   Social History Narrative   Not on file   Social Determinants of Health   Financial Resource Strain: Not on file  Food Insecurity: Not on file  Transportation Needs: Not on file  Physical Activity: Not on file  Stress: Not on file  Social Connections: Not on file   History reviewed. No pertinent family history. Scheduled Meds:  heparin injection (subcutaneous)  5,000 Units Subcutaneous Q8H   pantoprazole (PROTONIX) IV  40 mg Intravenous Q12H   potassium chloride  40 mEq Oral Once   potassium chloride  40 mEq Oral Once   Continuous Infusions:  sodium chloride     PRN Meds:.acetaminophen **OR** acetaminophen, ondansetron **OR** ondansetron (ZOFRAN) IV, polyethylene glycol, traZODone Allergies  Allergen Reactions   Penicillins    Review of Systems  Unable to perform ROS: Mental status change    Physical Exam Constitutional:      General: She is not in acute distress.    Appearance: She is ill-appearing.  Pulmonary:     Effort: Pulmonary effort is normal.  Skin:    General: Skin is warm and dry.  Neurological:     Mental Status: She is alert. She is disoriented.     Vital Signs: BP 135/72 (BP Location: Left Arm)   Pulse 94   Temp 97.8 F (36.6 C) (Oral)   Resp 20   Wt 50.1 kg   SpO2 96%   BMI 21.57 kg/m  Pain Scale: Faces   Pain Score: Asleep   SpO2: SpO2: 96 % O2 Device:SpO2: 96 % O2 Flow Rate: .O2 Flow Rate (L/min): 2 L/min  IO: Intake/output summary:  Intake/Output Summary (Last 24 hours) at 10/26/2021 1401 Last data filed at 10/26/2021 0000 Gross per 24 hour  Intake 100 ml  Output --  Net 100 ml    LBM: Last BM Date : 10/25/21 Baseline Weight: Weight: 50.1 kg Most recent weight: Weight: 50.1 kg     Palliative Assessment/Data: PPS 50%     *Please note that this is a verbal dictation therefore any spelling or grammatical errors are due to the "[employer] [employer]" system interpretation.   Juel Burrow, DNP, AGNP-C Palliative Medicine  Team [phone] Pager: [phone]

## 2021-10-26 NOTE — Progress Notes (Signed)
[facility]) Hospital Liaison Note   Request received to discuss hospice services with family.  Daughter Anne Ng feels that patient and family are moving in Hospice care direction but would like to consult with patients PCP before moving forward.  Anne Ng was agreeable for [facility] to follow up next week, after she was made contact with patients PCP.    Provided contact information and advised Anne Ng how to reach [facility], if our services are needed before next week.   Kenna Gilbert BSN, RN  [facility] Nurse Liaison  [phone]

## 2021-10-26 NOTE — Assessment & Plan Note (Addendum)
-   This is acute kidney injury superimposed on stage IV chronic kidney disease. - Hold Lasix, start gentle hydration with IV normal saline.  Nephrology consult  Lab Results  Component Value Date   CREATININE 3.38 (H) 10/26/2021   CREATININE 3.09 (H) 10/25/2021   CREATININE 3.33 (H) 10/24/2021

## 2021-10-26 NOTE — Progress Notes (Signed)
  Progress Note   Patient: Courtney Holt RSW:[id number] DOB: 05-19-1934 DOA: 10/24/2021     1 DOS: the patient was seen and examined on 10/26/2021   Brief hospital course: 87 y.o. African-American female with medical history significant for iron deficiency anemia, hypertension and type 2 diabetes mellitus admitted for hypoglycemia  9/27: GI consult, stopping D5 9/28: Family is hoping to transition to hospice as an outpatient after discussion with PCP.  Still poor appetite and confusion   Assessment and Plan: * Hypoglycemia - Due to poor p.o. intake - Stopping glipizide. - Hemoglobin A1c of 4.9.  Do not recommend restarting Glucotrol or other diabetic medicine at discharge -Off D5.  Sugars maintaining  Pleural effusion due to CHF (congestive heart failure) (HCC) - Likely due to diastolic CHF, will hold Lasix considering worsening kidney function -  2D echo shows grade 1 diastolic dysfunction.  Normal LV systolic function Net IO Since Admission: 1,650 mL [10/26/21 1356]  Acute kidney injury superimposed on chronic kidney disease ([place]) - This is acute kidney injury superimposed on stage IV chronic kidney disease. - Hold Lasix, start gentle hydration with IV normal saline.  Nephrology consult  Lab Results  Component Value Date   CREATININE 3.38 (H) 10/26/2021   CREATININE 3.09 (H) 10/25/2021   CREATININE 3.33 (H) 10/24/2021    Essential hypertension -continue her antihypertensives  Diabetes mellitus type 2, controlled, without complications (HCC) - Hemoglobin A1c of 4.9.  Patient does not need any diabetic medicine.  Dyslipidemia - continue statin therapy  History of CVA (cerebrovascular accident) Holding aspirin due to concern for GI bleed.  GI bleed Patient had 3 tarry bowel movement with bright red blood from rectum with a drop of hemoglobin from 10.1-8.7 GI consult obtained with Dr. Alice Reichert.  They had discussion with patient's family and conservative management plan.  No  luminal evaluation for now We will transfuse if hemoglobin drops less than 7.  Hold aspirin for now.    Acute metabolic encephalopathy Likely due to hypoglycemia.  Cannot rule out underlying dementia with behavioral disturbances        Subjective: Pleasantly confused.  Daughter at bedside  Physical Exam: Vitals:   10/25/21 2005 10/26/21 0341 10/26/21 0500 10/26/21 1052  BP: (!) 150/83 (!) 156/90  135/72  Pulse: (!) 102 (!) 103  94  Resp: 20 18  20   Temp: 98.2 F (36.8 C) 97.8 F (36.6 C)    TempSrc: Oral Oral    SpO2: 96% 95%  96%  Weight:   101.101 kg    87 year old female lying in the bed comfortably without any acute distress Lungs clear to auscultation bilaterally Cardiovascular regular rate and rhythm Abdomen soft, benign Neuro alert and awake, nonfocal Skin no rash or lesion  Data Reviewed:  Potassium 3.3, creatinine 3.38, hemoglobin 8.8  Family Communication: Daughter updated at bedside  Disposition: Status is: Inpatient Remains inpatient appropriate because: Electrolyte replacement, nephrology consult due to worsening kidney function   Planned Discharge Destination: Home with Home Health    DVT prophylaxis heparin Time spent: 35 minutes  Author: Max Sane, MD 10/26/2021 1:59 PM  For on call review [email or web address].

## 2021-10-26 NOTE — Assessment & Plan Note (Signed)
-   Due to poor p.o. intake - Stopping glipizide. - Hemoglobin A1c of 4.9.  Do not recommend restarting Glucotrol or other diabetic medicine at discharge -Off D5.  Sugars maintaining

## 2021-10-27 DIAGNOSIS — R4182 Altered mental status, unspecified: Secondary | ICD-10-CM

## 2021-10-27 DIAGNOSIS — I509 Heart failure, unspecified: Secondary | ICD-10-CM | POA: Diagnosis not present

## 2021-10-27 DIAGNOSIS — E162 Hypoglycemia, unspecified: Secondary | ICD-10-CM | POA: Diagnosis not present

## 2021-10-27 DIAGNOSIS — G9341 Metabolic encephalopathy: Secondary | ICD-10-CM | POA: Diagnosis not present

## 2021-10-27 LAB — BASIC METABOLIC PANEL
Anion gap: 4 — ABNORMAL LOW (ref 5–15)
BUN: 27 mg/dL — ABNORMAL HIGH (ref 8–23)
CO2: 18 mmol/L — ABNORMAL LOW (ref 22–32)
Calcium: 7.2 mg/dL — ABNORMAL LOW (ref 8.9–10.3)
Chloride: 122 mmol/L — ABNORMAL HIGH (ref 98–111)
Creatinine, Ser: 3.16 mg/dL — ABNORMAL HIGH (ref 0.44–1.00)
GFR, Estimated: 14 mL/min — ABNORMAL LOW (ref >60)
Glucose, Bld: 82 mg/dL (ref 70–99)
Potassium: 2.9 mmol/L — ABNORMAL LOW (ref 3.5–5.1)
Sodium: 144 mmol/L (ref 135–145)

## 2021-10-27 LAB — CBC
HCT: 23.4 % — ABNORMAL LOW (ref 36.0–46.0)
Hemoglobin: 7.6 g/dL — ABNORMAL LOW (ref 12.0–15.0)
MCH: 28.1 pg (ref 26.0–34.0)
MCHC: 32.5 g/dL (ref 30.0–36.0)
MCV: 86.7 fL (ref 80.0–100.0)
Platelets: 325 10*3/uL (ref 150–400)
RBC: 2.7 MIL/uL — ABNORMAL LOW (ref 3.87–5.11)
RDW: 19.7 % — ABNORMAL HIGH (ref 11.5–15.5)
WBC: 7.9 10*3/uL (ref 4.0–10.5)
nRBC: 0 % (ref 0.0–0.2)

## 2021-10-27 LAB — GLUCOSE, CAPILLARY
Glucose-Capillary: 77 mg/dL (ref 70–99)
Glucose-Capillary: 79 mg/dL (ref 70–99)
Glucose-Capillary: 105 mg/dL — ABNORMAL HIGH (ref 70–99)
Glucose-Capillary: 88 mg/dL (ref 70–99)

## 2021-10-27 LAB — CULTURE, BLOOD (ROUTINE X 2)
Culture: NO GROWTH
Culture: NO GROWTH

## 2021-10-27 MED ORDER — MEGESTROL ACETATE 20 MG PO TABS: 20.0000 mg | ORAL_TABLET | Freq: Every day | ORAL | 0 refills | Status: AC

## 2021-10-27 NOTE — Progress Notes (Signed)
[facility]  ROUNDING NOTE   Subjective:   Patient seen resting in bed Son and safety sitter at bedside.  Appetite remains poor No lower extremity edema  Objective:  Vital signs in last 24 hours:  Temp:  [97.9 F (36.6 C)-98.6 F (37 C)] 98.1 F (36.7 C) (09/29 0713) Pulse Rate:  [88-96] 88 (09/29 0713) Resp:  [16] 16 (09/29 0713) BP: (139-145)/(68-119) 144/68 (09/29 0713) SpO2:  [97 %-99 %] 99 % (09/29 0713)  Weight change:  Filed Weights   10/26/21 0500  Weight: 50.1 kg    Intake/Output: I/O last 3 completed shifts: In: 117.8 [P.O.:100; I.V.:17.8] Out: -    Intake/Output this shift:  No intake/output data recorded.  Physical Exam: General: NAD  Head: Normocephalic, atraumatic. Moist oral mucosal membranes  Eyes: Anicteric  Lungs:  Clear to auscultation, normal effort  Heart: Regular rate and rhythm  Abdomen:  Soft, nontender  Extremities:  No peripheral edema.  Neurologic: Nonfocal, moving all four extremities  Skin: No lesions  Access: None    Basic Metabolic Panel: Recent Labs  Lab 10/24/21 1700 10/25/21 0500 10/26/21 0602 10/27/21 0740  NA 144 144 144 144  K 3.5 4.1 3.3* 2.9*  CL 117* 120* 119* 122*  CO2 19* 16* 18* 18*  GLUCOSE 69* 85 70 82  BUN 32* 29* 28* 27*  CREATININE 3.33* 3.09* 3.38* 3.16*  CALCIUM 8.9 8.1* 7.9* 7.2*    Liver Function Tests: Recent Labs  Lab 10/24/21 1700  AST 27  ALT 12  ALKPHOS 75  BILITOT 0.6  PROT 6.6  ALBUMIN 3.1*   No results for input(s): "LIPASE", "AMYLASE" in the last 168 hours. No results for input(s): "AMMONIA" in the last 168 hours.  CBC: Recent Labs  Lab 10/24/21 1700 10/25/21 0500 10/25/21 0933 10/26/21 0602 10/27/21 0740  WBC 7.7 10.0  --  9.2 7.9  NEUTROABS 6.1  --   --   --   --   HGB 10.1* 8.9* 8.7* 8.8* 7.6*  HCT 31.8* 28.6* 27.7* 27.7* 23.4*  MCV 87.1 88.5  --  87.1 86.7  PLT 380 361  --  337 325    Cardiac Enzymes: Recent Labs  Lab 10/24/21 1700  CKTOTAL  228    BNP: Invalid input(s): "POCBNP"  CBG: Recent Labs  Lab 10/26/21 2100 10/27/21 0754 10/27/21 0815 10/27/21 0834 10/27/21 0919  GLUCAP 129* 77 79 88 105*    Microbiology: Results for orders placed or performed during the hospital encounter of 10/24/21  Culture, blood (routine x 2)     Status: None (Preliminary result)   Collection Time: 10/24/21  5:00 PM   Specimen: BLOOD  Result Value Ref Range Status   Specimen Description BLOOD LEFT ANTECUBITAL  Final   Special Requests   Final    BOTTLES DRAWN AEROBIC AND ANAEROBIC Blood Culture results may not be optimal due to an excessive volume of blood received in culture bottles   Culture   Final    NO GROWTH 3 DAYS Performed at [facility], [address], [place], [place] [address]    Report Status PENDING  Incomplete  Resp Panel by RT-PCR (Flu A&B, Covid) Anterior Nasal Swab     Status: None   Collection Time: 10/24/21  5:00 PM   Specimen: Anterior Nasal Swab  Result Value Ref Range Status   SARS Coronavirus 2 by RT PCR NEGATIVE NEGATIVE Final    Comment: (NOTE) SARS-CoV-2 target nucleic acids are NOT DETECTED.  The SARS-CoV-2 RNA  is generally detectable in upper respiratory specimens during the acute phase of infection. The lowest concentration of SARS-CoV-2 viral copies this assay can detect is 138 copies/mL. A negative result does not preclude SARS-Cov-2 infection and should not be used as the sole basis for treatment or other patient management decisions. A negative result may occur with  improper specimen collection/handling, submission of specimen other than nasopharyngeal swab, presence of viral mutation(s) within the areas targeted by this assay, and inadequate number of viral copies(<138 copies/mL). A negative result must be combined with clinical observations, patient history, and epidemiological information. The expected result is Negative.  Fact Sheet for Patients:   EntrepreneurPulse.com.au  Fact Sheet for Healthcare Providers:  IncredibleEmployment.be  This test is no t yet approved or cleared by the [place] FDA and  has been authorized for detection and/or diagnosis of SARS-CoV-2 by FDA under an Emergency Use Authorization (EUA). This EUA will remain  in effect (meaning this test can be used) for the duration of the COVID-19 declaration under Section 564(b)(1) of the Act, 21 U.S.C.section 360bbb-3(b)(1), unless the authorization is terminated  or revoked sooner.       Influenza A by PCR NEGATIVE NEGATIVE Final   Influenza B by PCR NEGATIVE NEGATIVE Final    Comment: (NOTE) The Xpert Xpress SARS-CoV-2/FLU/RSV plus assay is intended as an aid in the diagnosis of influenza from Nasopharyngeal swab specimens and should not be used as a sole basis for treatment. Nasal washings and aspirates are unacceptable for Xpert Xpress SARS-CoV-2/FLU/RSV testing.  Fact Sheet for Patients: EntrepreneurPulse.com.au  Fact Sheet for Healthcare Providers: IncredibleEmployment.be  This test is not yet approved or cleared by the [place] FDA and has been authorized for detection and/or diagnosis of SARS-CoV-2 by FDA under an Emergency Use Authorization (EUA). This EUA will remain in effect (meaning this test can be used) for the duration of the COVID-19 declaration under Section 564(b)(1) of the Act, 21 U.S.C. section 360bbb-3(b)(1), unless the authorization is terminated or revoked.  Performed at [facility], [address] [place], [place] [address]   Culture, blood (routine x 2)     Status: None (Preliminary result)   Collection Time: 10/24/21  6:00 PM   Specimen: BLOOD  Result Value Ref Range Status   Specimen Description BLOOD LEFT ANTECUBITAL  Final   Special Requests   Final    BOTTLES DRAWN AEROBIC AND ANAEROBIC Blood Culture results may not be optimal  due to an excessive volume of blood received in culture bottles   Culture   Final    NO GROWTH 3 DAYS Performed at [facility], [address], [place], [place] [address]    Report Status PENDING  Incomplete    Coagulation Studies: No results for input(s): "LABPROT", "INR" in the last 72 hours.  Urinalysis: Recent Labs    10/24/21 2351  COLORURINE STRAW*  LABSPEC 1.008  PHURINE 5.0  GLUCOSEU NEGATIVE  HGBUR MODERATE*  BILIRUBINUR NEGATIVE  KETONESUR NEGATIVE  PROTEINUR 100*  NITRITE NEGATIVE  LEUKOCYTESUR NEGATIVE      Imaging: US RENAL  Result Date: 10/26/2021 CLINICAL DATA:  Acute kidney injury, history hypertension EXAM: RENAL / URINARY TRACT ULTRASOUND COMPLETE COMPARISON:  None Available. FINDINGS: Right Kidney: Renal measurements: 8.0 x 4.4 x 3.9 cm = volume: 72 mL. Cortical thinning. Increased cortical echogenicity. Complicated cyst mid inferior pole 11 x 10 x 10 mm containing a small septation. No additional mass, hydronephrosis, or shadowing calcification. Left Kidney: Renal measurements: 9.3 x 5.1  x 4.3 cm = volume: 105 mL. Cortical thinning. Increased cortical echogenicity. Cyst at upper pole medially 21 x 19 x 18 mm. No additional mass, hydronephrosis, or shadowing calcification. Bladder: Mildly trabeculated bladder without mass. Other: Small amount of free pelvic fluid. Complicated cystic lesion identified in pelvis 7.4 x 5.7 x 5.2 cm, question complicated cystic ovarian neoplasm or other complex cystic lesion; dedicated pelvic sonography or CT imaging recommended for further assessment. IMPRESSION: Cortical atrophy and medical renal disease changes of both kidneys. Simple cyst LEFT kidney 2.1 cm diameter; no follow-up imaging recommended. Minimally complicated cyst LEFT kidney 1.1 cm diameter, containing a thin partial septation; no follow-up imaging recommended. Complex cystic lesion within pelvis 7.4 x 5.7 x 5.2 cm cannot exclude ovarian neoplasm; dedicated  pelvic sonography or CT imaging recommended. Electronically Signed   By: Lavonia Dana M.D.   On: 10/26/2021 15:42     Medications:    sodium chloride 75 mL/hr at 10/26/21 1653    heparin injection (subcutaneous)  5,000 Units Subcutaneous Q8H   pantoprazole (PROTONIX) IV  40 mg Intravenous Q12H   potassium chloride  40 mEq Oral Once   potassium chloride  40 mEq Oral Once   acetaminophen **OR** acetaminophen, ondansetron **OR** ondansetron (ZOFRAN) IV, polyethylene glycol, traZODone  Assessment/ Plan:  Ms. Courtney Holt is a 87 y.o.  female with past medical history including hypertension, type 2 diabetes, and iron deficiency anemia, who was admitted to [facility] on 10/24/2021 for Hypoglycemia [E16.2] Altered mental status, unspecified altered mental status type [I14.43] Acute metabolic encephalopathy [X54.00]   Acute kidney injury with chronic kidney disease stage IV. Recent baseline unknown, Creatinine 2 with GFR 26 in 2019. Renal ultrasound positive for simple cyst without hydronephrosis.  No recent IV contrast exposure or illness.  Acute kidney injury likely secondary to hypovolemia from poor oral intake and continued diuretic use.  Continue to hold hydrochlorothiazide.   Creatinine slightly improved but patient remains in dialysis territory. Patient would make a poor long term dialysis candidate due to her comorbidity and deconditioning. This was discussed with son, but he states they would be interested in trying dialysis if needed. No immediate need for dialysis. Patient is clear to discharge from renal stance and will follow up in office in 1-2 weeks. Hospice consulted and will follow up outpatient.   Lab Results  Component Value Date   CREATININE 3.16 (H) 10/27/2021   CREATININE 3.38 (H) 10/26/2021   CREATININE 3.09 (H) 10/25/2021    Intake/Output Summary (Last 24 hours) at 10/27/2021 1423 Last data filed at 10/27/2021 0715 Gross per 24 hour  Intake 17.82 ml  Output --  Net 17.82 ml    2. Anemia of chronic kidney disease Lab Results  Component Value Date   HGB 7.6 (L) 10/27/2021   Hgb remains decreased. Conservative management by GI  3. Secondary Hyperparathyroidism:  Lab Results  Component Value Date   CALCIUM 7.2 (L) 10/27/2021    Will continue to monitor bone minerals.   4. Hypertension with chronic kidney disease.  Home regimen includes amlodipine, carvedilol, and hydrochlorothiazide.  All currently held.     LOS: 2 [place] 9/29/20232:23 PM

## 2021-10-27 NOTE — Care Management Important Message (Signed)
Important Message  Patient Details  Name: Courtney Holt MRN: [id number] Date of Birth: 09/02/1934   Medicare Important Message Given:  N/A - LOS <3 / Initial given by admissions     Dannette Barbara 10/27/2021, 9:04 AM

## 2021-10-27 NOTE — TOC Transition Note (Signed)
Transition of Care [facility]) - CM/SW Discharge Note   Patient Details  Name: Courtney Holt MRN: [id number] Date of Birth: Aug 19, 1934  [facility] CM/SW Contact:  Beverly Sessions, RN Phone Number: 10/27/2021, 11:50 AM   Clinical Narrative:    Both son and daughter have my direct contact information to let me know their decision about home health.  They were both aware that I needed a decision before patient discharged in order to arrange, or they would need to follow up with their PCP  I had not heard from them about their decision so I went to the room again to follow up.  Patient has discharge, daughter transported          Patient Goals and CMS Choice        Discharge Placement                       Discharge Plan and Services                                     Social Determinants of Health (SDOH) Interventions     Readmission Risk Interventions     No data to display

## 2021-10-27 NOTE — TOC Progression Note (Signed)
Transition of Care [facility]) - Progression Note    Patient Details  Name: Courtney Holt MRN: [id number] Date of Birth: 21-Feb-1934  Transition of Care [facility]) CM/SW Contact  Beverly Sessions, RN Phone Number: 10/27/2021, 8:53 AM  Clinical Narrative:    Patient to discharge today Family to transport Daughter states that she will be at the hospital at 14,  she will discuss home health with her services with patients son and will call me with a decision         Expected Discharge Plan and Services           Expected Discharge Date: 10/27/21                                     Social Determinants of Health (SDOH) Interventions    Readmission Risk Interventions     No data to display

## 2021-10-27 NOTE — Progress Notes (Signed)
Discharge instructions were reviewed with pt's daughter. Questions were encourage and answered. IV was taken out. VSS. Physical assessment was done. No c/o at this time.  

## 2021-10-28 NOTE — Discharge Summary (Signed)
Physician Discharge Summary   Patient: Courtney Holt MRN: [id number] DOB: Mar 20, 1934  Admit date:     10/24/2021  Discharge date: 10/27/2021  Discharge Physician: Max Sane   [facility]P: [facility]   Recommendations at discharge:   Follow-up with outpatient providers  Discharge Diagnoses: Principal Problem:   Hypoglycemia Active Problems:   Pleural effusion due to CHF (congestive heart failure) (HCC)   Essential hypertension   Acute kidney injury superimposed on chronic kidney disease (HCC)   Dyslipidemia   Diabetes mellitus type 2, controlled, without complications ([place])   History of CVA (cerebrovascular accident)   Acute metabolic encephalopathy   GI bleed   Altered mental status  Hospital Course: 87 y.o. African-American female with medical history significant for iron deficiency anemia, hypertension and type 2 diabetes mellitus admitted for hypoglycemia  9/27: GI consult, stopping D5 9/28: Family is hoping to transition to hospice as an outpatient after discussion with PCP.  Still poor appetite and confusion  Assessment and Plan: * Hypoglycemia - Due to poor p.o. intake.  Initially required D5 IV fluid but then stopped and maintaining blood sugar now - Stopping glipizide. - Hemoglobin A1c of 4.9.  Do not recommend restarting Glucotrol or other diabetic medicine at discharge  Pleural effusion due to CHF (congestive heart failure) ([place]) - Likely due to diastolic CHF, CHF is well compensated at this time -  2D echo shows grade 1 diastolic dysfunction.  Normal LV systolic function  Acute kidney injury superimposed on chronic kidney disease IV ([place]) -Family is not too sure on long-term dialysis need if it comes down to it.  They would like to follow-up with her PCP and have discussion with nephrology as an outpatient  Lab Results  Component Value Date   CREATININE 3.38 (H) 10/26/2021   CREATININE 3.09 (H) 10/25/2021   CREATININE 3.33 (H) 10/24/2021    Essential hypertension -continue her antihypertensives  Diabetes mellitus type 2, controlled, without complications (HCC) - Hemoglobin A1c of 4.9.  Patient does not need any diabetic medicine.  Dyslipidemia - continue statin therapy  History of CVA (cerebrovascular accident) Holding aspirin due to concern for GI bleed.  GI bleed Patient had 3 tarry bowel movement with bright red blood from rectum with a drop of hemoglobin from 10.1-7.6 GI consult obtained with Dr. Alice Reichert.  They had discussion with patient's family and conservative management plan.  No luminal evaluation for now We will transfuse if hemoglobin drops less than 7.  Hold aspirin for now.    Acute metabolic encephalopathy Likely due to hypoglycemia.  She likely also has underlying dementia with behavioral disturbances  Goals of care: Family would like to keep her comfortable and hoping to involve hospice through her primary care      Consultants: Nephrology, palliative care, GI Disposition: Home Diet recommendation:  Discharge Diet Orders (From admission, onward)     Start     Ordered   10/27/21 0000  Diet - low sodium heart healthy        10/27/21 0838           Carb modified diet DISCHARGE MEDICATION: Allergies as of 10/27/2021       Reactions   Penicillins         Medication List     STOP taking these medications    aspirin EC 81 MG tablet   atorvastatin 40 MG tablet Commonly known as: LIPITOR   glipiZIDE 5 MG tablet Commonly known as: GLUCOTROL   hydrochlorothiazide 25 MG tablet  Commonly known as: HYDRODIURIL       TAKE these medications    amLODipine 10 MG tablet Commonly known as: NORVASC Take 10 mg by mouth daily.   carvedilol 12.5 MG tablet Commonly known as: COREG Take 12.5 mg by mouth 2 (two) times daily.   megestrol 20 MG tablet Commonly known as: MEGACE Take 1 tablet (20 mg total) by mouth daily.        [place], [employer]. Go in 3 day(s).   Why: [facility] Discharge F/UP  Oct 30, 2021 AT 11:00 Contact information: [place] [address] [phone]         Lateef, Munsoor, MD. Schedule an appointment as soon as possible for a visit in 2 week(s).   Specialty: Nephrology Why: [facility] Discharge F/UP Contact information: [place]  [phone] [phone]                Discharge Exam: Filed Weights   10/26/21 0500  Weight: 3.60 kg   87 year old female lying in the bed comfortably without any acute distress Lungs clear to auscultation bilaterally Cardiovascular regular rate and rhythm Abdomen soft, benign Neuro alert and awake, nonfocal Skin no rash or lesion  Condition at discharge: poor  The results of significant diagnostics from this hospitalization (including imaging, microbiology, ancillary and laboratory) are listed below for reference.   Imaging Studies: US RENAL  Result Date: 10/26/2021 CLINICAL DATA:  Acute kidney injury, history hypertension EXAM: RENAL / URINARY TRACT ULTRASOUND COMPLETE COMPARISON:  None Available. FINDINGS: Right Kidney: Renal measurements: 8.0 x 4.4 x 3.9 cm = volume: 72 mL. Cortical thinning. Increased cortical echogenicity. Complicated cyst mid inferior pole 11 x 10 x 10 mm containing a small septation. No additional mass, hydronephrosis, or shadowing calcification. Left Kidney: Renal measurements: 9.3 x 5.1 x 4.3 cm = volume: 105 mL. Cortical thinning. Increased cortical echogenicity. Cyst at upper pole medially 21 x 19 x 18 mm. No additional mass, hydronephrosis, or shadowing calcification. Bladder: Mildly trabeculated bladder without mass. Other: Small amount of free pelvic fluid. Complicated cystic lesion identified in pelvis 7.4 x 5.7 x 5.2 cm, question complicated cystic ovarian neoplasm or other complex cystic lesion; dedicated pelvic sonography or CT imaging recommended for further assessment.  IMPRESSION: Cortical atrophy and medical renal disease changes of both kidneys. Simple cyst LEFT kidney 2.1 cm diameter; no follow-up imaging recommended. Minimally complicated cyst LEFT kidney 1.1 cm diameter, containing a thin partial septation; no follow-up imaging recommended. Complex cystic lesion within pelvis 7.4 x 5.7 x 5.2 cm cannot exclude ovarian neoplasm; dedicated pelvic sonography or CT imaging recommended. Electronically Signed   By: Lavonia Dana M.D.   On: 10/26/2021 15:42   ECHOCARDIOGRAM COMPLETE  Result Date: 10/25/2021    ECHOCARDIOGRAM REPORT   Patient Name:   ALAIA LORDI Date of Exam: 10/25/2021 Medical Rec #:  [id number]      Height:       60.0 in Accession #:    [id number]     Weight:       101.0 lb Date of Birth:  [birth year]      BSA:          1.396 m Patient Age:    36 years       BP:           163/74 mmHg Patient Gender: F  HR:           104 bpm. Exam Location:  [facility] Procedure: 2D Echo, Color Doppler and Cardiac Doppler Indications:     I50.31 congestive heart failure-Acute Diastolic  History:         Patient has no prior history of Echocardiogram examinations.                  CKD; Risk Factors:Hypertension.  Sonographer:     Charmayne Sheer Referring Phys:  [id number] JAN A MANSY Diagnosing Phys: Kate Sable MD  Sonographer Comments: No subcostal window. IMPRESSIONS  1. Left ventricular ejection fraction, by estimation, is 55 to 60%. The left ventricle has normal function. The left ventricle has no regional wall motion abnormalities. There is mild left ventricular hypertrophy. Left ventricular diastolic parameters are consistent with Grade I diastolic dysfunction (impaired relaxation).  2. Right ventricular systolic function is normal. The right ventricular size is normal. Mildly increased right ventricular wall thickness.  3. Moderate pericardial effusion. The pericardial effusion is circumferential. There is no evidence of cardiac tamponade.  4. The mitral valve is  grossly normal. Mild mitral valve regurgitation.  5. The aortic valve was not well visualized. Aortic valve regurgitation is not visualized. Aortic valve sclerosis/calcification is present, without any evidence of aortic stenosis. FINDINGS  Left Ventricle: Left ventricular ejection fraction, by estimation, is 55 to 60%. The left ventricle has normal function. The left ventricle has no regional wall motion abnormalities. The left ventricular internal cavity size was normal in size. There is  mild left ventricular hypertrophy. Left ventricular diastolic parameters are consistent with Grade I diastolic dysfunction (impaired relaxation). Right Ventricle: The right ventricular size is normal. Mildly increased right ventricular wall thickness. Right ventricular systolic function is normal. Left Atrium: Left atrial size was normal in size. Right Atrium: Right atrial size was normal in size. Pericardium: A moderately sized pericardial effusion is present. The pericardial effusion is circumferential. There is no evidence of cardiac tamponade. Mitral Valve: The mitral valve is grossly normal. Mild mitral valve regurgitation. Tricuspid Valve: The tricuspid valve is normal in structure. Tricuspid valve regurgitation is not demonstrated. Aortic Valve: The aortic valve was not well visualized. Aortic valve regurgitation is not visualized. Aortic valve sclerosis/calcification is present, without any evidence of aortic stenosis. Aortic valve mean gradient measures 8.0 mmHg. Aortic valve peak gradient measures 13.1 mmHg. Aortic valve area, by VTI measures 1.71 cm. Pulmonic Valve: The pulmonic valve was normal in structure. Pulmonic valve regurgitation is trivial. Aorta: The aortic root and ascending aorta are structurally normal, with no evidence of dilitation. Venous: The inferior vena cava was not well visualized. IAS/Shunts: No atrial level shunt detected by color flow Doppler.  LEFT VENTRICLE PLAX 2D LVIDd:         3.80 cm    Diastology LVIDs:         2.60 cm   LV e' medial:    3.37 cm/s LV PW:         0.90 cm   LV E/e' medial:  37.3 LV IVS:        0.80 cm   LV e' lateral:   13.30 cm/s LVOT diam:     1.80 cm   LV E/e' lateral: 9.5 LV SV:         58 LV SV Index:   42 LVOT Area:     2.54 cm  RIGHT VENTRICLE RV Basal diam:  2.30 cm RV S prime:     20.00 cm/s LEFT  ATRIUM             Index        RIGHT ATRIUM          Index LA diam:        3.40 cm 2.44 cm/m   RA Area:     8.32 cm LA Vol (A2C):   35.3 ml 25.28 ml/m  RA Volume:   15.30 ml 10.96 ml/m LA Vol (A4C):   31.5 ml 22.56 ml/m LA Biplane Vol: 35.0 ml 25.07 ml/m  AORTIC VALVE                     PULMONIC VALVE AV Area (Vmax):    1.72 cm      PV Vmax:       1.27 m/s AV Area (Vmean):   1.60 cm      PV Peak grad:  6.5 mmHg AV Area (VTI):     1.71 cm AV Vmax:           181.00 cm/s AV Vmean:          131.000 cm/s AV VTI:            0.340 m AV Peak Grad:      13.1 mmHg AV Mean Grad:      8.0 mmHg LVOT Vmax:         122.00 cm/s LVOT Vmean:        82.300 cm/s LVOT VTI:          0.228 m LVOT/AV VTI ratio: 0.67  AORTA Ao Root diam: 2.30 cm MITRAL VALVE MV Area (PHT): 3.43 cm     SHUNTS MV Decel Time: 221 msec     Systemic VTI:  0.23 m MV E velocity: 125.70 cm/s  Systemic Diam: 1.80 cm MV A velocity: 159.00 cm/s MV E/A ratio:  0.79 Kate Sable MD Electronically signed by Kate Sable MD Signature Date/Time: 10/25/2021/5:00:54 PM    Final    CT Head Wo Contrast  Result Date: 10/24/2021 CLINICAL DATA:  Altered mental status. EXAM: CT HEAD WITHOUT CONTRAST TECHNIQUE: Contiguous axial images were obtained from the base of the skull through the vertex without intravenous contrast. RADIATION DOSE REDUCTION: This exam was performed according to the departmental dose-optimization program which includes automated exposure control, adjustment of the mA and/or kV according to patient size and/or use of iterative reconstruction technique. COMPARISON:  None Available. FINDINGS: Brain:  There is moderate severity cerebral atrophy with widening of the extra-axial spaces and ventricular dilatation. There are areas of decreased attenuation within the white matter tracts of the supratentorial brain, consistent with microvascular disease changes. 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 Small, bilateral basal ganglia lacunar infarcts are noted. Vascular: No hyperdense vessel or unexpected calcification. Skull: Normal. Negative for fracture or focal lesion. Sinuses/Orbits: No acute finding. Other: None. IMPRESSION: 1. Generalized cerebral atrophy and chronic white matter small vessel ischemic changes without evidence of an acute intracranial abnormality. 2. 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 3. Small, bilateral basal ganglia lacunar infarcts. Electronically Signed   By: Virgina Norfolk M.D.   On: 10/24/2021 17:42   DG Chest Port 1 View  Result Date: 10/24/2021 CLINICAL DATA:  Unresponsive EXAM: PORTABLE CHEST 1 VIEW COMPARISON:  03/23/2006 FINDINGS: Cardiomegaly with at least small bilateral effusions. Probable skin fold artifact right chest. Vascular congestion. Patchy atelectasis at the bases. No definitive pneumothorax. IMPRESSION: Cardiomegaly with vascular congestion and pleural effusions. Electronically Signed  By: Donavan Foil M.D.   On: 10/24/2021 17:12    Microbiology: Results for orders placed or performed during the hospital encounter of 10/24/21  Culture, blood (routine x 2)     Status: None (Preliminary result)   Collection Time: 10/24/21  5:00 PM   Specimen: BLOOD  Result Value Ref Range Status   Specimen Description BLOOD LEFT ANTECUBITAL  Final   Special Requests   Final    BOTTLES DRAWN AEROBIC AND ANAEROBIC Blood Culture results may not be optimal due to an excessive volume of blood received in culture bottles   Culture   Final    NO GROWTH 4 DAYS Performed at [facility], [address] [place], [place] [address]    Report Status PENDING  Incomplete  Resp Panel by RT-PCR (Flu A&B, Covid) Anterior Nasal Swab     Status: None   Collection Time: 10/24/21  5:00 PM   Specimen: Anterior Nasal Swab  Result Value Ref Range Status   SARS Coronavirus 2 by RT PCR NEGATIVE NEGATIVE Final    Comment: (NOTE) SARS-CoV-2 target nucleic acids are NOT DETECTED.  The SARS-CoV-2 RNA is generally detectable in upper respiratory specimens during the acute phase of infection. The lowest concentration of SARS-CoV-2 viral copies this assay can detect is 138 copies/mL. A negative result does not preclude SARS-Cov-2 infection and should not be used as the sole basis for treatment or other patient management decisions. A negative result may occur with  improper specimen collection/handling, submission of specimen other than nasopharyngeal swab, presence of viral mutation(s) within the areas targeted by this assay, and inadequate number of viral copies(<138 copies/mL). A negative result must be combined with clinical observations, patient history, and epidemiological information. The expected result is Negative.  Fact Sheet for Patients:  EntrepreneurPulse.com.au  Fact Sheet for Healthcare Providers:  IncredibleEmployment.be  This test is no t yet approved or cleared by the [place] FDA and  has been authorized for detection and/or diagnosis of SARS-CoV-2 by FDA under an Emergency Use Authorization (EUA). This EUA will remain  in effect (meaning this test can be used) for the duration of the COVID-19 declaration under Section 564(b)(1) of the Act, 21 U.S.C.section 360bbb-3(b)(1), unless the authorization is terminated  or revoked sooner.       Influenza A by PCR NEGATIVE NEGATIVE Final   Influenza B by PCR NEGATIVE NEGATIVE Final    Comment: (NOTE) The Xpert Xpress SARS-CoV-2/FLU/RSV plus assay is intended as an aid in the diagnosis of influenza  from Nasopharyngeal swab specimens and should not be used as a sole basis for treatment. Nasal washings and aspirates are unacceptable for Xpert Xpress SARS-CoV-2/FLU/RSV testing.  Fact Sheet for Patients: EntrepreneurPulse.com.au  Fact Sheet for Healthcare Providers: IncredibleEmployment.be  This test is not yet approved or cleared by the [place] FDA and has been authorized for detection and/or diagnosis of SARS-CoV-2 by FDA under an Emergency Use Authorization (EUA). This EUA will remain in effect (meaning this test can be used) for the duration of the COVID-19 declaration under Section 564(b)(1) of the Act, 21 U.S.C. section 360bbb-3(b)(1), unless the authorization is terminated or revoked.  Performed at [facility], [address] [place], [place] [address]   Culture, blood (routine x 2)     Status: None (Preliminary result)   Collection Time: 10/24/21  6:00 PM   Specimen: BLOOD  Result Value Ref Range Status   Specimen Description BLOOD LEFT ANTECUBITAL  Final   Special Requests  Final    BOTTLES DRAWN AEROBIC AND ANAEROBIC Blood Culture results may not be optimal due to an excessive volume of blood received in culture bottles   Culture   Final    NO GROWTH 4 DAYS Performed at [facility], [address] [place], [place] [address]    Report Status PENDING  Incomplete    Labs: CBC: Recent Labs  Lab 10/24/21 1700 10/25/21 0500 10/25/21 0933 10/26/21 0602 10/27/21 0740  WBC 7.7 10.0  --  9.2 7.9  NEUTROABS 6.1  --   --   --   --   HGB 10.1* 8.9* 8.7* 8.8* 7.6*  HCT 31.8* 28.6* 27.7* 27.7* 23.4*  MCV 87.1 88.5  --  87.1 86.7  PLT 380 361  --  337 062   Basic Metabolic Panel: Recent Labs  Lab 10/24/21 1700 10/25/21 0500 10/26/21 0602 10/27/21 0740  NA 144 144 144 144  K 3.5 4.1 3.3* 2.9*  CL 117* 120* 119* 122*  CO2 19* 16* 18* 18*  GLUCOSE 69* 85 70 82  BUN 32* 29* 28* 27*  CREATININE  3.33* 3.09* 3.38* 3.16*  CALCIUM 8.9 8.1* 7.9* 7.2*   Liver Function Tests: Recent Labs  Lab 10/24/21 1700  AST 27  ALT 12  ALKPHOS 75  BILITOT 0.6  PROT 6.6  ALBUMIN 3.1*   CBG: Recent Labs  Lab 10/26/21 2100 10/27/21 0754 10/27/21 0815 10/27/21 0834 10/27/21 0919  GLUCAP 129* 77 79 88 105*    Discharge time spent: greater than 30 minutes.  Signed: Max Sane, MD [facility] 10/28/2021

## 2021-10-29 LAB — CULTURE, BLOOD (ROUTINE X 2)

## 2022-06-20 ENCOUNTER — Inpatient Hospital Stay: Payer: Medicare Other

## 2022-06-20 ENCOUNTER — Encounter: Payer: Self-pay | Admitting: Emergency Medicine

## 2022-06-20 ENCOUNTER — Inpatient Hospital Stay
Admission: EM | Admit: 2022-06-20 | Discharge: 2022-06-25 | DRG: 682 | Disposition: A | Discharge status: Skilled Nursing Facility | Payer: Medicare Other | Attending: Student | Admitting: Student

## 2022-06-20 ENCOUNTER — Other Ambulatory Visit: Payer: Self-pay

## 2022-06-20 ENCOUNTER — Emergency Department: Payer: Medicare Other

## 2022-06-20 DIAGNOSIS — Z1152 Encounter for screening for COVID-19: Secondary | ICD-10-CM | POA: Diagnosis not present

## 2022-06-20 DIAGNOSIS — D631 Anemia in chronic kidney disease: Secondary | ICD-10-CM | POA: Diagnosis present

## 2022-06-20 DIAGNOSIS — E1122 Type 2 diabetes mellitus with diabetic chronic kidney disease: Secondary | ICD-10-CM | POA: Diagnosis present

## 2022-06-20 DIAGNOSIS — Z681 Body mass index (BMI) 19 or less, adult: Secondary | ICD-10-CM | POA: Diagnosis not present

## 2022-06-20 DIAGNOSIS — E1151 Type 2 diabetes mellitus with diabetic peripheral angiopathy without gangrene: Secondary | ICD-10-CM | POA: Diagnosis present

## 2022-06-20 DIAGNOSIS — D649 Anemia, unspecified: Secondary | ICD-10-CM

## 2022-06-20 DIAGNOSIS — E872 Acidosis, unspecified: Secondary | ICD-10-CM | POA: Diagnosis present

## 2022-06-20 DIAGNOSIS — F17200 Nicotine dependence, unspecified, uncomplicated: Secondary | ICD-10-CM | POA: Diagnosis present

## 2022-06-20 DIAGNOSIS — I5030 Unspecified diastolic (congestive) heart failure: Secondary | ICD-10-CM

## 2022-06-20 DIAGNOSIS — Z880 Allergy status to penicillin: Secondary | ICD-10-CM | POA: Diagnosis not present

## 2022-06-20 DIAGNOSIS — N186 End stage renal disease: Secondary | ICD-10-CM | POA: Diagnosis present

## 2022-06-20 DIAGNOSIS — I872 Venous insufficiency (chronic) (peripheral): Secondary | ICD-10-CM | POA: Diagnosis present

## 2022-06-20 DIAGNOSIS — I132 Hypertensive heart and chronic kidney disease with heart failure and with stage 5 chronic kidney disease, or end stage renal disease: Secondary | ICD-10-CM | POA: Diagnosis present

## 2022-06-20 DIAGNOSIS — Z79899 Other long term (current) drug therapy: Secondary | ICD-10-CM

## 2022-06-20 DIAGNOSIS — G9349 Other encephalopathy: Secondary | ICD-10-CM | POA: Diagnosis present

## 2022-06-20 DIAGNOSIS — Z91148 Patient's other noncompliance with medication regimen for other reason: Secondary | ICD-10-CM | POA: Diagnosis not present

## 2022-06-20 DIAGNOSIS — D509 Iron deficiency anemia, unspecified: Secondary | ICD-10-CM | POA: Diagnosis present

## 2022-06-20 DIAGNOSIS — Z66 Do not resuscitate: Secondary | ICD-10-CM | POA: Diagnosis present

## 2022-06-20 DIAGNOSIS — I10 Essential (primary) hypertension: Secondary | ICD-10-CM

## 2022-06-20 DIAGNOSIS — N2581 Secondary hyperparathyroidism of renal origin: Secondary | ICD-10-CM | POA: Diagnosis present

## 2022-06-20 DIAGNOSIS — Z8673 Personal history of transient ischemic attack (TIA), and cerebral infarction without residual deficits: Secondary | ICD-10-CM | POA: Diagnosis not present

## 2022-06-20 DIAGNOSIS — N189 Chronic kidney disease, unspecified: Secondary | ICD-10-CM | POA: Diagnosis not present

## 2022-06-20 DIAGNOSIS — I5032 Chronic diastolic (congestive) heart failure: Secondary | ICD-10-CM | POA: Diagnosis present

## 2022-06-20 DIAGNOSIS — K922 Gastrointestinal hemorrhage, unspecified: Secondary | ICD-10-CM | POA: Diagnosis not present

## 2022-06-20 DIAGNOSIS — R630 Anorexia: Secondary | ICD-10-CM | POA: Diagnosis present

## 2022-06-20 DIAGNOSIS — N179 Acute kidney failure, unspecified: Secondary | ICD-10-CM | POA: Diagnosis present

## 2022-06-20 DIAGNOSIS — G9340 Encephalopathy, unspecified: Secondary | ICD-10-CM | POA: Diagnosis present

## 2022-06-20 DIAGNOSIS — G9341 Metabolic encephalopathy: Secondary | ICD-10-CM | POA: Diagnosis present

## 2022-06-20 DIAGNOSIS — I161 Hypertensive emergency: Secondary | ICD-10-CM | POA: Diagnosis present

## 2022-06-20 DIAGNOSIS — E119 Type 2 diabetes mellitus without complications: Secondary | ICD-10-CM | POA: Diagnosis not present

## 2022-06-20 DIAGNOSIS — Z515 Encounter for palliative care: Secondary | ICD-10-CM | POA: Diagnosis not present

## 2022-06-20 DIAGNOSIS — Z7189 Other specified counseling: Secondary | ICD-10-CM | POA: Diagnosis not present

## 2022-06-20 DIAGNOSIS — R32 Unspecified urinary incontinence: Secondary | ICD-10-CM | POA: Diagnosis present

## 2022-06-20 DIAGNOSIS — E785 Hyperlipidemia, unspecified: Secondary | ICD-10-CM | POA: Diagnosis present

## 2022-06-20 DIAGNOSIS — R636 Underweight: Secondary | ICD-10-CM | POA: Diagnosis present

## 2022-06-20 DIAGNOSIS — K6289 Other specified diseases of anus and rectum: Secondary | ICD-10-CM | POA: Diagnosis present

## 2022-06-20 HISTORY — DX: Other specified diseases of anus and rectum: K62.89

## 2022-06-20 HISTORY — DX: Venous insufficiency (chronic) (peripheral): I87.2

## 2022-06-20 HISTORY — DX: Type 2 diabetes mellitus without complications: E11.9

## 2022-06-20 LAB — BASIC METABOLIC PANEL
Anion gap: 11 (ref 5–15)
BUN: 52 mg/dL — ABNORMAL HIGH (ref 8–23)
CO2: 13 mmol/L — ABNORMAL LOW (ref 22–32)
Calcium: 7.9 mg/dL — ABNORMAL LOW (ref 8.9–10.3)
Chloride: 117 mmol/L — ABNORMAL HIGH (ref 98–111)
Creatinine, Ser: 6.54 mg/dL — ABNORMAL HIGH (ref 0.44–1.00)
GFR, Estimated: 6 mL/min — ABNORMAL LOW (ref >60)
Glucose, Bld: 96 mg/dL (ref 70–99)
Potassium: 4.1 mmol/L (ref 3.5–5.1)
Sodium: 141 mmol/L (ref 135–145)

## 2022-06-20 LAB — CBC
HCT: 21.1 % — ABNORMAL LOW (ref 36.0–46.0)
Hemoglobin: 6.8 g/dL — ABNORMAL LOW (ref 12.0–15.0)
MCH: 30.2 pg (ref 26.0–34.0)
MCHC: 32.2 g/dL (ref 30.0–36.0)
MCV: 93.8 fL (ref 80.0–100.0)
Platelets: 224 10*3/uL (ref 150–400)
RBC: 2.25 MIL/uL — ABNORMAL LOW (ref 3.87–5.11)
RDW: 16.5 % — ABNORMAL HIGH (ref 11.5–15.5)
WBC: 9.3 10*3/uL (ref 4.0–10.5)
nRBC: 0 % (ref 0.0–0.2)

## 2022-06-20 LAB — ABO/RH: ABO/RH(D): O POS

## 2022-06-20 LAB — TSH: TSH: 3.363 u[IU]/mL (ref 0.350–4.500)

## 2022-06-20 LAB — PREPARE RBC (CROSSMATCH)

## 2022-06-20 LAB — GLUCOSE, CAPILLARY: Glucose-Capillary: 113 mg/dL — ABNORMAL HIGH (ref 70–99)

## 2022-06-20 MED ORDER — HYDRALAZINE HCL 20 MG/ML IJ SOLN
5.0000 mg | Freq: Three times a day (TID) | INTRAMUSCULAR | Status: DC | PRN
  Administered 2022-06-20: 5 mg via INTRAVENOUS
  Filled 2022-06-20: qty 1

## 2022-06-20 MED ORDER — LABETALOL HCL 5 MG/ML IV SOLN
10.0000 mg | Freq: Once | INTRAVENOUS | Status: AC
  Administered 2022-06-20: 10 mg via INTRAVENOUS
  Filled 2022-06-20: qty 4

## 2022-06-20 MED ORDER — INSULIN ASPART 100 UNIT/ML IJ SOLN
0.0000 [IU] | Freq: Three times a day (TID) | INTRAMUSCULAR | Status: DC
  Administered 2022-06-21: 1 [IU] via SUBCUTANEOUS
  Administered 2022-06-23: 3 [IU] via SUBCUTANEOUS
  Administered 2022-06-24 (×2): 1 [IU] via SUBCUTANEOUS
  Filled 2022-06-20 (×3): qty 1

## 2022-06-20 MED ORDER — ONDANSETRON HCL 4 MG/2ML IJ SOLN: 4.0000 mg | Freq: Four times a day (QID) | INTRAMUSCULAR | Status: DC | PRN

## 2022-06-20 MED ORDER — SODIUM CHLORIDE 0.9 % IV SOLN: 10.0000 mL/h | Freq: Once | INTRAVENOUS | Status: DC

## 2022-06-20 MED ORDER — CARVEDILOL 6.25 MG PO TABS
12.5000 mg | ORAL_TABLET | Freq: Two times a day (BID) | ORAL | Status: DC
  Administered 2022-06-20 – 2022-06-25 (×9): 12.5 mg via ORAL
  Filled 2022-06-20 (×9): qty 2

## 2022-06-20 MED ORDER — ACETAMINOPHEN 325 MG PO TABS: 650.0000 mg | ORAL_TABLET | Freq: Four times a day (QID) | ORAL | Status: DC | PRN

## 2022-06-20 MED ORDER — LACTATED RINGERS IV SOLN: INTRAVENOUS | Status: DC

## 2022-06-20 MED ORDER — SODIUM CHLORIDE 0.9% FLUSH
3.0000 mL | Freq: Two times a day (BID) | INTRAVENOUS | Status: DC
  Administered 2022-06-21 – 2022-06-25 (×8): 3 mL via INTRAVENOUS

## 2022-06-20 MED ORDER — AMLODIPINE BESYLATE 10 MG PO TABS
10.0000 mg | ORAL_TABLET | Freq: Every day | ORAL | Status: DC
  Administered 2022-06-20 – 2022-06-25 (×6): 10 mg via ORAL
  Filled 2022-06-20: qty 1
  Filled 2022-06-20: qty 2
  Filled 2022-06-20 (×4): qty 1

## 2022-06-20 MED ORDER — SODIUM CHLORIDE 0.9 % IV BOLUS
500.0000 mL | Freq: Once | INTRAVENOUS | Status: AC
  Administered 2022-06-20: 500 mL via INTRAVENOUS

## 2022-06-20 MED ORDER — ACETAMINOPHEN 650 MG RE SUPP: 650.0000 mg | Freq: Four times a day (QID) | RECTAL | Status: DC | PRN

## 2022-06-20 MED ORDER — ONDANSETRON HCL 4 MG PO TABS: 4.0000 mg | ORAL_TABLET | Freq: Four times a day (QID) | ORAL | Status: DC | PRN

## 2022-06-20 NOTE — Assessment & Plan Note (Signed)
Acute on chronic anemia with hemoglobin of 6.8.  Stool exam was brown grossly, however FOBT is positive.  Most likely an occult GI bleed in addition to anemia of chronic renal disease.  No known episodes of vomiting or diarrhea.  Will transfuse 1 unit and reassess clinically.    - 1 unit packed RBCs ordered - Posttransfusion CBC - Transfuse for hemoglobin less than 7 - If hemoglobin continues to decline or does not improve as expected, could consider GI consultation at that time

## 2022-06-20 NOTE — Assessment & Plan Note (Signed)
-   SSI, very sensitive - A1c pending

## 2022-06-20 NOTE — ED Provider Notes (Signed)
[facility] Provider Note    Event Date/Time   First MD Initiated Contact with Patient 06/20/22 1523     (approximate)   History   Weakness   HPI  Courtney Holt is a 88 y.o. female with a history of iron deficiency anemia, hypertension, kidney disease, CHF, GI bleed, CVA, and hypertension who presents with increased generalized weakness which has been progressing for some time but worsened over the last week.  Over the last few days the patient was noted to be weaker than normal, unable to complete ADLs, not taking her medications, and today was so weak that she was not able to get into a car even with assistance of family.  The patient has also appeared more confused over the last 1 to 2 days.  She normally is will answer questions and follow most commands.  I reviewed the past medical records.  The patient was most recently admitted in September of last year; per the hospitalist discharge summary she presented with hypoglycemia due to poor p.o. intake and also had AKI and a pleural effusion.   Physical Exam   Triage Vital Signs: ED Triage Vitals [06/20/22 1257]  Enc Vitals Group     BP (!) 208/113     Pulse Rate 99     Resp 18     Temp 98.4 F (36.9 C)     Temp Source Oral     SpO2 100 %     Weight      Height      Head Circumference      Peak Flow      Pain Score 0     Pain Loc      Pain Edu?      Excl. in GC?     Most recent vital signs: Vitals:   06/20/22 1615 06/20/22 1630  BP:  (!) 189/87  Pulse: 88 78  Resp: (!) 25 11  Temp:    SpO2: 99% 98%     General: Alert, oriented x 2, but confused appearing. CV:  Good peripheral perfusion.  Resp:  Normal effort.  Lungs CTAB. Abd:  Soft and nontender.  No distention.  Other:  EOMI.  PERRLA.  No facial droop.  Motor intact in all extremities.  Confused appearing, not following most commands, not responding to most questions appropriately.   ED Results / Procedures / Treatments    Labs (all labs ordered are listed, but only abnormal results are displayed) Labs Reviewed  BASIC METABOLIC PANEL - Abnormal; Notable for the following components:      Result Value   Chloride 117 (*)    CO2 13 (*)    BUN 52 (*)    Creatinine, Ser 6.54 (*)    Calcium 7.9 (*)    GFR, Estimated 6 (*)    All other components within normal limits  CBC - Abnormal; Notable for the following components:   RBC 2.25 (*)    Hemoglobin 6.8 (*)    HCT 21.1 (*)    RDW 16.5 (*)    All other components within normal limits  URINALYSIS, ROUTINE W REFLEX MICROSCOPIC  CBG MONITORING, ED  PREPARE RBC (CROSSMATCH)  TYPE AND SCREEN  ABO/RH     EKG  ED ECG REPORT I, Dionne Bucy, the attending physician, personally viewed and interpreted this ECG.  Date: 06/20/2022 EKG Time: 1308 Rate: 96 Rhythm: normal sinus rhythm QRS Axis: Left axis Intervals: normal ST/T Wave abnormalities: normal Narrative Interpretation: no evidence  of acute ischemia   RADIOLOGY  CT head: I independently viewed and interpreted the images; there is no ICH or other acute abnormality.  PROCEDURES:  Critical Care performed: No  Procedures   MEDICATIONS ORDERED IN ED: Medications  0.9 %  sodium chloride infusion (has no administration in time range)  labetalol (NORMODYNE) injection 10 mg (10 mg Intravenous Given 06/20/22 1612)  sodium chloride 0.9 % bolus 500 mL (500 mLs Intravenous New Bag/Given 06/20/22 1612)     IMPRESSION / MDM / ASSESSMENT AND PLAN / ED COURSE  I reviewed the triage vital signs and the nursing notes.  88 year old female with PMH as noted above presents with increased generalized weakness over the last week and altered mental status over the last few days.  On exam she is significantly hypertensive.  She appears confused.  Neurologic exam is nonfocal.  Initial lab workup shows worsened hemoglobin of 6.8 down from baseline between 8-9 and increased creatinine from  3-6.5.  Differential diagnosis includes, but is not limited to, worsening anemia, AKI, uremia, dehydration, UTI or other infection.  I have a low suspicion for CNS cause.  The patient is significantly hypertensive although I suspect that this is due to medication noncompliance.  We will give IV labetalol, fluid bolus, transfused PRBCs, obtain a CT head, urinalysis, and reassess.  I anticipate that the patient will need admission.  Patient's presentation is most consistent with acute presentation with potential threat to life or bodily function.  The patient is on the cardiac monitor to evaluate for evidence of arrhythmia and/or significant heart rate changes.  ----------------------------------------- 5:21 PM on 06/20/2022 -----------------------------------------  CT head is negative.  DRE reveals brown stool which is guaiac positive, although the patient is on iron.  Blood pressure is improved after labetalol.  I consulted Dr. Huel Cote from the hospitalist service; based on her discussion she agrees to evaluate the patient for admission.  FINAL CLINICAL IMPRESSION(S) / ED DIAGNOSES   Final diagnoses:  Anemia, unspecified type  AKI (acute kidney injury) (HCC)     Rx / DC Orders   ED Discharge Orders     None        Note:  This document was prepared using Dragon voice recognition software and may include unintentional dictation errors.    Dionne Bucy, MD 06/20/22 [phone]

## 2022-06-20 NOTE — ED Triage Notes (Signed)
Patient to ED via [facility] from home for generalized weakness. Daughter states she has noticed a decline over the past week. Patient unable to walk to get into car today- lives at home independently. More confused than normal. Daughter states she normally can care for self. Daughter asking to speak with social work "for the next steps." Does take BP meds normally- family unsure if she has been taking them or eating/drinking like normal.

## 2022-06-20 NOTE — Assessment & Plan Note (Signed)
Patient appears euvolemic on examination.  No indication for diuretics at this time.  - Daily weights - Strict in and out

## 2022-06-20 NOTE — ED Notes (Signed)
Daughter at bedside with patient states that patient is usually A&Ox4 and cooks and cleans for herself as well as manages her own medications. Daughter is unsure if she has been taking her medications and states that she has noticed a significant decline in patient over the last week. Pt is alert and oriented to self. Pt states that her birthday is in [birth year], but she forgot the date. Pt states that she is in the hospital, but does not know where. Daughter states that patient does have hypertension, and she is unsure if she has taken her BP medication

## 2022-06-20 NOTE — Assessment & Plan Note (Signed)
Most likely occult in nature.  Stool was brown on exam with FOBT positive.  Given patient's advanced age and severe renal disease, do not feel an urgent consultation for /endoscopy/colonoscopy is warranted at this time.  - Consider consulting GI if hematemesis, hematochezia or melena are noted

## 2022-06-20 NOTE — ED Notes (Signed)
Assisted MD with rectal exam. Hemoccult positive per MD

## 2022-06-20 NOTE — ED Notes (Signed)
Assisted patient on to bed pan without success. Placed patient on chuck pad. Disposed of soiled brief. Pt's pants placed in belongings bag on chair in room.

## 2022-06-20 NOTE — H&P (Signed)
History and Physical    Patient: Courtney Holt RUE:[id number] DOB: [birth year] DOA: 06/20/2022 DOS: the patient was seen and examined on 06/20/2022 PCP: [facility]  Patient coming from: Home  Chief Complaint:  Chief Complaint  Patient presents with   Weakness   HPI: Courtney Holt is a 88 y.o. female with medical history significant of hypertension, HFpEF, CKD stage IV, CVA, hyperlipidemia, type 2 diabetes, who presents to the ED due to altered mental status.  History obtained through chart review given patient's altered mental status and no family at bedside.  Patient presented to the ED via EMS after family noted that she was more confused than usual.  Her daughter reported a decline over the past Holt with gradually worsening generalized weakness.  In the past few days, they had noticed that she was having difficulty performing ADLs and was unable to get out of the car independently.  Prior to the symptoms, patient was normally able to take care of of herself and lives independently.  Due to this, family is unsure if she has been taking her home medications or eating/drinking normally.   ED course: On arrival to the ED, patient was hypertensive at 208/113 with heart rate of 99.  She was saturating at 100% on room air.  She was afebrile at 98.4.  CBC with hemoglobin of 6.8 and platelets of 224.  BMP with potassium 4.1, bicarb 13, BUN 52, creatinine 6.52 with GFR of 6. CT head was obtained with no acute or cranial abnormality.  FOBT was positive.  1 unit of packed RBCs ordered.  Patient started on IV labetalol.  TRH contacted for admission.  Review of Systems: unable to review all systems due to the inability of the patient to answer questions.  Past Medical History:  Diagnosis Date   Hypertension    History reviewed. No pertinent surgical history. Social History:  reports that she has been smoking. She has never used smokeless tobacco. She reports current alcohol use.  She reports that she does not use drugs.  Allergies  Allergen Reactions   Penicillins     History reviewed. No pertinent family history.  Prior to Admission medications   Medication Sig Start Date End Date Taking? Authorizing Provider  amLODipine (NORVASC) 10 MG tablet Take 10 mg by mouth daily. 08/29/21   [provider]  carvedilol (COREG) 12.5 MG tablet Take 12.5 mg by mouth 2 (two) times daily. 08/29/21   [provider]    Physical Exam: Vitals:   06/20/22 1615 06/20/22 1630 06/20/22 1723 06/20/22 1739  BP:  (!) 189/87 (!) 196/94 (!) 183/96  Pulse: 88 78 77 80  Resp: (!) 25 11 18 18   Temp:   98.1 F (36.7 C) 98.4 F (36.9 C)  TempSrc:   Oral   SpO2: 99% 98% 100% 99%   Physical Exam Vitals and nursing note reviewed.  Constitutional:      General: She is not in acute distress.    Appearance: She is normal weight.  HENT:     Head: Normocephalic and atraumatic.     Mouth/Throat:     Mouth: Mucous membranes are moist.     Pharynx: Oropharynx is clear.     Comments: A dentulous Eyes:     Conjunctiva/sclera: Conjunctivae normal.     Pupils: Pupils are equal, round, and reactive to light.  Cardiovascular:     Rate and Rhythm: Normal rate and regular rhythm.     Heart sounds: No murmur heard. Pulmonary:  Effort: Pulmonary effort is normal. No respiratory distress.     Breath sounds: No wheezing, rhonchi or rales.  Abdominal:     General: Bowel sounds are normal. There is no distension.     Palpations: Abdomen is soft.     Tenderness: There is no abdominal tenderness. There is no guarding.  Musculoskeletal:     Right lower leg: No edema.     Left lower leg: No edema.  Skin:    General: Skin is warm and dry.  Neurological:     Mental Status: She is alert.     Comments:  Patient is alert and oriented to self, however cognition is fluctuating and occasionally she states that she is at home and then will states she is at the hospital.  She is  inconsistently able to follow commands, including to perform full strength testing.  5/5 strength of bilateral lower extremities, however unable to do strength of upper extremities, but seems to have no difficulty lifting against gravity.  No facial asymmetry or dysarthria noted.  Repeatedly stating she would like to put her britches on.   Psychiatric:        Cognition and Memory: Cognition is impaired. Memory is impaired.     Comments: Pleasantly confused.      Data Reviewed: CBC with WBC 9.3, hemoglobin 6.8, MCV 93, platelets of 224 BMP with sodium of 141, potassium 4.1, chloride 117, bicarb 13, glucose 96, BUN 52, creatinine 6.52, calcium 7.9 and GFR of 6  EKG personally reviewed.  Sinus rhythm with a rate of 96 no ischemic appearing ST or T wave changes.  CT Head Wo Contrast  Result Date: 06/20/2022 CLINICAL DATA:  Mental status change, unknown cause EXAM: CT HEAD WITHOUT CONTRAST TECHNIQUE: Contiguous axial images were obtained from the base of the skull through the vertex without intravenous contrast. RADIATION DOSE REDUCTION: This exam was performed according to the departmental dose-optimization program which includes automated exposure control, adjustment of the mA and/or kV according to patient size and/or use of iterative reconstruction technique. COMPARISON:  CT Head 10/24/21 FINDINGS: Brain: There is sequela of severe chronic microvascular ischemic change. No hemorrhage. No extra-axial fluid collection. No hydrocephalus. There are chronic infarcts in the left parietal and right occipital lobes. There is also a chronic infarct in the superior left cerebellum. Mineralization of the basal ganglia on the left. Vascular: No hyperdense vessel or unexpected calcification. Skull: Normal. Negative for fracture or focal lesion. Sinuses/Orbits: Trace bilateral mastoid effusions. No middle ear effusion. Paranasal sinuses are clear. Orbits are unremarkable. Other: None. IMPRESSION: 1. No acute  intracranial abnormality. 2. Sequela of severe chronic microvascular ischemic change. Chronic infarcts in the left parietal lobe, right occipital lobe, and left superior cerebellum Electronically Signed   By: Lorenza Cambridge M.D.   On: 06/20/2022 16:33    Results are pending, will review when available.  Assessment and Plan:  * Acute encephalopathy Patient is presenting with 1 Holt of generalized weakness and approximately 2 days of increased altered mental status of uncertain etiology.  Most likely etiology at this point is uremic encephalopathy secondary to acute on chronic renal failure.  No focal neurological findings on exam to suggest CVA, however can obtain MRI.  - Management of AKI as noted below - MRI brain without contrast - PT/OT/SLP  Acute kidney injury superimposed on chronic kidney disease (HCC) Patient presenting with creatinine of 6.54 and GFR of 6 in the setting of previous CKD stage IV.  Difficult to discern if  this is progression of her CKD 4 versus acute onset.  Will start with gentle hydration and consult nephrology.  If dialysis is needed, will need to have a goals of care discussion with family first.  - Nephrology consulted; appreciate their recommendations - Urinalysis pending - Renal ultrasound - LR 100 cc/h - Sodium bicarb twice daily - Strict in and out, although this may be difficult due to patient's incontinence - Avoid nephrotoxic agents  Acute on chronic anemia Acute on chronic anemia with hemoglobin of 6.8.  Stool exam was brown grossly, however FOBT is positive.  Most likely an occult GI bleed in addition to anemia of chronic renal disease.  No known episodes of vomiting or diarrhea.  Will transfuse 1 unit and reassess clinically.    - 1 unit packed RBCs ordered - Posttransfusion CBC - Transfuse for hemoglobin less than 7 - If hemoglobin continues to decline or does not improve as expected, could consider GI consultation at that time  GI bleed Most  likely occult in nature.  Stool was brown on exam with FOBT positive.  Given patient's advanced age and severe renal disease, do not feel an urgent consultation for /endoscopy/colonoscopy is warranted at this time.  - Consider consulting GI if hematemesis, hematochezia or melena are noted  Diabetes mellitus type 2, controlled, without complications (HCC) - SSI, very sensitive - A1c pending  Essential hypertension Patient significantly hypertensive on admission, likely due to medication noncompliance in the setting of acute encephalopathy.  Will plan to restart home meds and use PRNs as needed.  - Restart home amlodipine and carvedilol  (HFpEF) heart failure with preserved ejection fraction [facility]) Patient appears euvolemic on examination.  No indication for diuretics at this time.  - Daily weights - Strict in and out  Advance Care Planning:   Code Status: Full Code no family at bedside if patient is unable to make medical decisions at this time.  Consults: Nephrology  Family Communication: No family at bedside  Severity of Illness: The appropriate patient status for this patient is INPATIENT. Inpatient status is judged to be reasonable and necessary in order to provide the required intensity of service to ensure the patient's safety. The patient's presenting symptoms, physical exam findings, and initial radiographic and laboratory data in the context of their chronic comorbidities is felt to place them at high risk for further clinical deterioration. Furthermore, it is not anticipated that the patient will be medically stable for discharge from the hospital within 2 midnights of admission.   * I certify that at the point of admission it is my clinical judgment that the patient will require inpatient hospital care spanning beyond 2 midnights from the point of admission due to high intensity of service, high risk for further deterioration and high frequency of surveillance  required.*  Author: Verdene Lennert, MD 06/20/2022 6:27 PM  For on call review [email or web address].

## 2022-06-20 NOTE — Assessment & Plan Note (Signed)
Patient is presenting with 1 week of generalized weakness and approximately 2 days of increased altered mental status of uncertain etiology.  Most likely etiology at this point is uremic encephalopathy secondary to acute on chronic renal failure.  No focal neurological findings on exam to suggest CVA, however can obtain MRI.  - Management of AKI as noted below - MRI brain without contrast - PT/OT/SLP

## 2022-06-20 NOTE — Assessment & Plan Note (Signed)
Patient significantly hypertensive on admission, likely due to medication noncompliance in the setting of acute encephalopathy.  Will plan to restart home meds and use PRNs as needed.  - Restart home amlodipine and carvedilol

## 2022-06-20 NOTE — Assessment & Plan Note (Signed)
Patient presenting with creatinine of 6.54 and GFR of 6 in the setting of previous CKD stage IV.  Difficult to discern if this is progression of her CKD 4 versus acute onset.  Will start with gentle hydration and consult nephrology.  If dialysis is needed, will need to have a goals of care discussion with family first.  - Nephrology consulted; appreciate their recommendations - Urinalysis pending - Renal ultrasound - LR 100 cc/h - Sodium bicarb twice daily - Strict in and out, although this may be difficult due to patient's incontinence - Avoid nephrotoxic agents

## 2022-06-20 NOTE — ED Triage Notes (Signed)
First Nurse Note;  Pt via EMS from home. Family states she is more confused than normal. Denies dementia hx. Family may thought it was her sugar and it was too low, CBG got 116. Pt has a hx of CKD. Denies dialysis. Pt has a hx of HTN.  186/120 BP  130 HR  99% on RA  97.6 oral  20 G R AC

## 2022-06-21 ENCOUNTER — Inpatient Hospital Stay: Payer: Medicare Other

## 2022-06-21 ENCOUNTER — Encounter: Payer: Self-pay | Admitting: Internal Medicine

## 2022-06-21 DIAGNOSIS — Z66 Do not resuscitate: Secondary | ICD-10-CM

## 2022-06-21 DIAGNOSIS — Z7189 Other specified counseling: Secondary | ICD-10-CM | POA: Diagnosis not present

## 2022-06-21 DIAGNOSIS — G9340 Encephalopathy, unspecified: Secondary | ICD-10-CM | POA: Diagnosis not present

## 2022-06-21 DIAGNOSIS — Z515 Encounter for palliative care: Secondary | ICD-10-CM | POA: Diagnosis not present

## 2022-06-21 DIAGNOSIS — N179 Acute kidney failure, unspecified: Secondary | ICD-10-CM | POA: Diagnosis not present

## 2022-06-21 LAB — IRON AND TIBC
Iron: 83 ug/dL (ref 28–170)
Saturation Ratios: 35 % — ABNORMAL HIGH (ref 10.4–31.8)
TIBC: 238 ug/dL — ABNORMAL LOW (ref 250–450)
UIBC: 155 ug/dL

## 2022-06-21 LAB — CBC WITH DIFFERENTIAL/PLATELET
Abs Immature Granulocytes: 0.04 10*3/uL (ref 0.00–0.07)
Basophils Absolute: 0 10*3/uL (ref 0.0–0.1)
Basophils Relative: 0 %
Eosinophils Absolute: 0.2 10*3/uL (ref 0.0–0.5)
Eosinophils Relative: 2 %
HCT: 26.3 % — ABNORMAL LOW (ref 36.0–46.0)
Hemoglobin: 8.7 g/dL — ABNORMAL LOW (ref 12.0–15.0)
Immature Granulocytes: 1 %
Lymphocytes Relative: 19 %
Lymphs Abs: 1.7 10*3/uL (ref 0.7–4.0)
MCH: 29.8 pg (ref 26.0–34.0)
MCHC: 33.1 g/dL (ref 30.0–36.0)
MCV: 90.1 fL (ref 80.0–100.0)
Monocytes Absolute: 0.5 10*3/uL (ref 0.1–1.0)
Monocytes Relative: 6 %
Neutro Abs: 6.3 10*3/uL (ref 1.7–7.7)
Neutrophils Relative %: 72 %
Platelets: 214 10*3/uL (ref 150–400)
RBC: 2.92 MIL/uL — ABNORMAL LOW (ref 3.87–5.11)
RDW: 16.3 % — ABNORMAL HIGH (ref 11.5–15.5)
WBC: 8.7 10*3/uL (ref 4.0–10.5)
nRBC: 0 % (ref 0.0–0.2)

## 2022-06-21 LAB — GLUCOSE, CAPILLARY
Glucose-Capillary: 80 mg/dL (ref 70–99)
Glucose-Capillary: 159 mg/dL — ABNORMAL HIGH (ref 70–99)
Glucose-Capillary: 147 mg/dL — ABNORMAL HIGH (ref 70–99)

## 2022-06-21 LAB — TYPE AND SCREEN
ABO/RH(D): O POS
Antibody Screen: NEGATIVE
Unit division: 0

## 2022-06-21 LAB — BPAM RBC
Blood Product Expiration Date: 202406252359
ISSUE DATE / TIME: 202405221711
Unit Type and Rh: 5100

## 2022-06-21 LAB — BASIC METABOLIC PANEL
Anion gap: 11 (ref 5–15)
BUN: 56 mg/dL — ABNORMAL HIGH (ref 8–23)
CO2: 14 mmol/L — ABNORMAL LOW (ref 22–32)
Calcium: 7.8 mg/dL — ABNORMAL LOW (ref 8.9–10.3)
Chloride: 120 mmol/L — ABNORMAL HIGH (ref 98–111)
Creatinine, Ser: 6.51 mg/dL — ABNORMAL HIGH (ref 0.44–1.00)
GFR, Estimated: 6 mL/min — ABNORMAL LOW (ref >60)
Glucose, Bld: 92 mg/dL (ref 70–99)
Potassium: 4.6 mmol/L (ref 3.5–5.1)
Sodium: 145 mmol/L (ref 135–145)

## 2022-06-21 LAB — PHOSPHORUS: Phosphorus: 5.8 mg/dL — ABNORMAL HIGH (ref 2.5–4.6)

## 2022-06-21 LAB — BRAIN NATRIURETIC PEPTIDE: B Natriuretic Peptide: 1834.8 pg/mL — ABNORMAL HIGH (ref 0.0–100.0)

## 2022-06-21 LAB — VITAMIN B12: Vitamin B-12: 1227 pg/mL — ABNORMAL HIGH (ref 180–914)

## 2022-06-21 LAB — VITAMIN D 25 HYDROXY (VIT D DEFICIENCY, FRACTURES): Vit D, 25-Hydroxy: 31.6 ng/mL (ref 30–100)

## 2022-06-21 LAB — FOLATE: Folate: 8.9 ng/mL (ref >5.9)

## 2022-06-21 LAB — MAGNESIUM: Magnesium: 2.5 mg/dL — ABNORMAL HIGH (ref 1.7–2.4)

## 2022-06-21 MED ORDER — PANTOPRAZOLE SODIUM 40 MG PO TBEC
40.0000 mg | DELAYED_RELEASE_TABLET | Freq: Two times a day (BID) | ORAL | Status: DC
  Administered 2022-06-21 – 2022-06-25 (×9): 40 mg via ORAL
  Filled 2022-06-21 (×9): qty 1

## 2022-06-21 MED ORDER — SODIUM CHLORIDE 0.45 % IV SOLN
INTRAVENOUS | Status: DC
  Filled 2022-06-21 (×11): qty 75

## 2022-06-21 MED ORDER — GLUCERNA SHAKE PO LIQD
237.0000 mL | Freq: Three times a day (TID) | ORAL | Status: DC
  Administered 2022-06-21 – 2022-06-25 (×11): 237 mL via ORAL

## 2022-06-21 NOTE — Evaluation (Signed)
Occupational Therapy Evaluation Patient Details Name: Courtney Holt MRN: [id number] DOB: 1934-11-18 Today's Date: 06/21/2022   History of Present Illness 88 y/o female presented to ED on 06/20/22 for increasing confusion. Admitted for acute encephalopathy. MRI negative. PMH: CKD, HFpEF, CVA, T2DM, HTN   Clinical Impression   Patient presenting with decreased Ind in self care,balance, functional mobility/transfers, endurance, and safety awareness. Patient is oriented to self only and does not answer any home set up questions. Her daughter is present in room and reports pt lives at home alone and is Ind in ambulation, self care tasks, and IADLs.  Pt is unable to verbalized her daughters name in room and believes she is at home. She does notify therapist that she needs to use bathroom and is assisted with min A into bathroom with use of RW. Pt needing assistance for balance, clothing management and hygiene. Patient will benefit from acute OT to increase overall independence in the areas of ADLs, functional mobility, and safety awareness in order to safely discharge.     Recommendations for follow up therapy are one component of a multi-disciplinary discharge planning process, led by the attending physician.  Recommendations may be updated based on patient status, additional functional criteria and insurance authorization.   Assistance Recommended at Discharge Frequent or constant Supervision/Assistance  Patient can return home with the following A lot of help with walking and/or transfers;A lot of help with bathing/dressing/bathroom;Assistance with cooking/housework;Assist for transportation;Help with stairs or ramp for entrance    Functional Status Assessment  Patient has had a recent decline in their functional status and demonstrates the ability to make significant improvements in function in a reasonable and predictable amount of time.  Equipment Recommendations  Other (comment) (defer to next  venue of care)       Precautions / Restrictions Precautions Precautions: Fall Restrictions Weight Bearing Restrictions: No      Mobility Bed Mobility Overal bed mobility: Needs Assistance Bed Mobility: Supine to Sit, Sit to Supine     Supine to sit: Min assist Sit to supine: Min assist        Transfers Overall transfer level: Needs assistance Equipment used: Rolling walker (2 wheels) Transfers: Sit to/from Stand Sit to Stand: Min assist                  Balance Overall balance assessment: Needs assistance Sitting-balance support: Feet supported Sitting balance-Leahy Scale: Fair     Standing balance support: Reliant on assistive device for balance, During functional activity, Bilateral upper extremity supported Standing balance-Leahy Scale: Poor                             ADL either performed or assessed with clinical judgement   ADL Overall ADL's : Needs assistance/impaired                         Toilet Transfer: Minimal assistance   Toileting- Clothing Manipulation and Hygiene: Sit to/from stand;Moderate assistance       Functional mobility during ADLs: Minimal assistance;Rolling walker (2 wheels)       Vision Patient Visual Report: No change from baseline              Pertinent Vitals/Pain Pain Assessment Pain Assessment: No/denies pain        Extremity/Trunk Assessment Upper Extremity Assessment Upper Extremity Assessment: Generalized weakness   Lower Extremity Assessment Lower Extremity Assessment: Generalized weakness   Cervical /  Trunk Assessment Cervical / Trunk Assessment: Kyphotic   Communication Communication Communication: No difficulties   Cognition Arousal/Alertness: Awake/alert Behavior During Therapy: WFL for tasks assessed/performed Overall Cognitive Status: Impaired/Different from baseline Area of Impairment: Orientation, Attention, Memory, Following commands, Problem solving,  Safety/judgement, Awareness                 Orientation Level: Disoriented to, Place, Time, Situation Current Attention Level: Sustained Memory: Decreased short-term memory Following Commands: Follows one step commands with increased time Safety/Judgement: Decreased awareness of safety, Decreased awareness of deficits Awareness: Intellectual Problem Solving: Slow processing, Requires verbal cues General Comments: Pt oriented to self only and is unable to verbalized who daughter is in room.                Home Living Family/patient expects to be discharged to:: Skilled nursing facility                                        Prior Functioning/Environment Prior Level of Function : Independent/Modified Independent             Mobility Comments: daughter reports patient was independent and had completed yard work week prior to admission ADLs Comments: Ind in self care and IADLs. She does not drive and family would assist her with getting to appointments and groceries.        OT Problem List: Decreased strength;Decreased activity tolerance;Decreased safety awareness;Impaired balance (sitting and/or standing);Decreased knowledge of use of DME or AE;Decreased cognition      OT Treatment/Interventions: Self-care/ADL training;Therapeutic exercise;Therapeutic activities;DME and/or AE instruction;Patient/family education;Balance training;Cognitive remediation/compensation    OT Goals(Current goals can be found in the care plan section) Acute Rehab OT Goals Patient Stated Goal: to get stronger and go to rehab OT Goal Formulation: With family Time For Goal Achievement: 07/05/22 Potential to Achieve Goals: Fair ADL Goals Pt Will Perform Grooming: with supervision Pt Will Perform Lower Body Dressing: with supervision;sit to/from stand Pt Will Transfer to Toilet: with supervision;ambulating Pt Will Perform Toileting - Clothing Manipulation and hygiene: with  supervision;sit to/from stand  OT Frequency: Min 2X/week       AM-PAC OT "6 Clicks" Daily Activity     Outcome Measure Help from another person eating meals?: None Help from another person taking care of personal grooming?: A Little Help from another person toileting, which includes using toliet, bedpan, or urinal?: A Lot Help from another person bathing (including washing, rinsing, drying)?: A Lot Help from another person to put on and taking off regular upper body clothing?: A Little Help from another person to put on and taking off regular lower body clothing?: A Lot 6 Click Score: 16   End of Session Equipment Utilized During Treatment: Rolling walker (2 wheels) Nurse Communication: Mobility status  Activity Tolerance: Patient tolerated treatment well Patient left: in bed;with call bell/phone within reach;with bed alarm set;with family/visitor present  OT Visit Diagnosis: Unsteadiness on feet (R26.81);Muscle weakness (generalized) (M62.81)                Time: 0940-1003 OT Time Calculation (min): 23 min Charges:  OT General Charges $OT Visit: 1 Visit OT Evaluation $OT Eval Moderate Complexity: 1 Mod OT Treatments $Self Care/Home Management : 8-22 mins  Jackquline Denmark, MS, OTR/L , CBIS ascom [phone]  06/21/22, 1:50 PM

## 2022-06-21 NOTE — Evaluation (Signed)
Physical Therapy Evaluation Patient Details Name: Courtney Holt MRN: [id number] DOB: August 16, 1934 Today's Date: 06/21/2022  History of Present Illness  88 y/o female presented to ED on 06/20/22 for increasing confusion. Admitted for acute encephalopathy. MRI negative. PMH: CKD, HFpEF, CVA, T2DM, HTN  Clinical Impression  Patient admitted with the above. PTA, patient was living alone and completely independent. Daughter present to provide PLOF. Currently patient presents with weakness, impaired balance, decreased activity tolerance, and impaired cognition. Oriented to self during session. Required minA for bed mobility and min guard for sit to stand and short ambulation with HHAx1. Patient declined further ambulation due to fatigue. Patient will benefit from skilled PT services during acute stay to address listed deficits. Patient will benefit from ongoing therapy at discharge to maximize functional independence and safety.        Recommendations for follow up therapy are one component of a multi-disciplinary discharge planning process, led by the attending physician.  Recommendations may be updated based on patient status, additional functional criteria and insurance authorization.  Follow Up Recommendations Can patient physically be transported by private vehicle: Yes     Assistance Recommended at Discharge Frequent or constant Supervision/Assistance  Patient can return home with the following  A little help with walking and/or transfers;A lot of help with bathing/dressing/bathroom;Assistance with cooking/housework;Direct supervision/assist for medications management;Direct supervision/assist for financial management;Assist for transportation;Help with stairs or ramp for entrance    Equipment Recommendations Rolling Jozef Eisenbeis (2 wheels)  Recommendations for Other Services       Functional Status Assessment Patient has had a recent decline in their functional status and demonstrates the ability  to make significant improvements in function in a reasonable and predictable amount of time.     Precautions / Restrictions Precautions Precautions: Fall Restrictions Weight Bearing Restrictions: No      Mobility  Bed Mobility Overal bed mobility: Needs Assistance Bed Mobility: Supine to Sit, Sit to Supine     Supine to sit: Min assist Sit to supine: Min assist        Transfers Overall transfer level: Needs assistance Equipment used: 1 person hand held assist Transfers: Sit to/from Stand Sit to Stand: Min guard           General transfer comment: increased time to complete    Ambulation/Gait Ambulation/Gait assistance: Min guard Gait Distance (Feet): 18 Feet Assistive device: 1 person hand held assist Gait Pattern/deviations: Step-to pattern, Decreased stride length Gait velocity: decreased     General Gait Details: min guard for safety. Patient grasping therapist shirt  Stairs            Wheelchair Mobility    Modified Rankin (Stroke Patients Only)       Balance Overall balance assessment: Mild deficits observed, not formally tested                                           Pertinent Vitals/Pain Pain Assessment Pain Assessment: No/denies pain    Home Living Family/patient expects to be discharged to:: Skilled nursing facility                        Prior Function Prior Level of Function : Independent/Modified Independent             Mobility Comments: daughter reports patient was independent and had completed yard work week prior to admission  Hand Dominance        Extremity/Trunk Assessment   Upper Extremity Assessment Upper Extremity Assessment: Defer to OT evaluation    Lower Extremity Assessment Lower Extremity Assessment: Generalized weakness    Cervical / Trunk Assessment Cervical / Trunk Assessment: Kyphotic  Communication   Communication: No difficulties  Cognition  Arousal/Alertness: Awake/alert Behavior During Therapy: WFL for tasks assessed/performed Overall Cognitive Status: Impaired/Different from baseline Area of Impairment: Orientation, Attention, Memory, Following commands, Problem solving, Safety/judgement, Awareness                 Orientation Level: Disoriented to, Place, Time, Situation Current Attention Level: Sustained Memory: Decreased short-term memory Following Commands: Follows one step commands with increased time Safety/Judgement: Decreased awareness of safety Awareness: Intellectual Problem Solving: Slow processing, Requires verbal cues          General Comments      Exercises     Assessment/Plan    PT Assessment Patient needs continued PT services  PT Problem List Decreased strength;Decreased activity tolerance;Decreased balance;Decreased mobility;Decreased cognition;Decreased knowledge of use of DME;Decreased safety awareness;Decreased knowledge of precautions       PT Treatment Interventions Gait training;DME instruction;Functional mobility training;Therapeutic activities;Balance training;Therapeutic exercise;Patient/family education    PT Goals (Current goals can be found in the Care Plan section)  Acute Rehab PT Goals Patient Stated Goal: did not state PT Goal Formulation: With family Time For Goal Achievement: 07/05/22 Potential to Achieve Goals: Fair    Frequency Min 2X/week     Co-evaluation               AM-PAC PT "6 Clicks" Mobility  Outcome Measure Help needed turning from your back to your side while in a flat bed without using bedrails?: A Little Help needed moving from lying on your back to sitting on the side of a flat bed without using bedrails?: A Little Help needed moving to and from a bed to a chair (including a wheelchair)?: A Little Help needed standing up from a chair using your arms (e.g., wheelchair or bedside chair)?: A Little Help needed to walk in hospital room?: A  Little Help needed climbing 3-5 steps with a railing? : A Lot 6 Click Score: 17    End of Session   Activity Tolerance: Patient tolerated treatment well Patient left: in bed;with call bell/phone within reach;with bed alarm set;with family/visitor present Nurse Communication: Mobility status PT Visit Diagnosis: Unsteadiness on feet (R26.81);Muscle weakness (generalized) (M62.81);Other abnormalities of gait and mobility (R26.89)    Time: 1610-9604 PT Time Calculation (min) (ACUTE ONLY): 19 min   Charges:   PT Evaluation $PT Eval Moderate Complexity: 1 Mod          Maylon Peppers, PT, DPT Physical Therapist - [place]  [facility]   Michaeal Davis A Hercules Hasler 06/21/2022, 1:21 PM

## 2022-06-21 NOTE — Consult Note (Signed)
Consultation Note Date: 06/21/2022   Patient Name: Courtney Holt  DOB: 1934/10/20  MRN: [id number]  Age / Sex: 88 y.o., female  PCP: [facility] Referring Physician: Gillis Santa, MD  Reason for Consultation: Establishing goals of care  HPI/Patient Profile: 88 y.o. female  with past medical history of hypertension, HFpEF, CKD stage IV, CVA, hyperlipidemia, and type 2 diabetes admitted on 06/20/2022 with AMS. Found to have hypertensive emergency  as well as creatinine 6.53 on admission. Mental status attributed to renal failure. Also with multifactorial anemia - received 1 unit PRBCs. PMT consulted to discuss GOC.   Clinical Assessment and Goals of Care: I have reviewed medical records including EPIC notes, labs and imaging, received report from nurse tech at bedside, assessed the patient and then spoke with patient's daughter Drinda Butts  to discuss diagnosis prognosis, GOC, EOL wishes, disposition and options.  Nursing staff shares patient confused all day.   No family at bedside.   I introduced Palliative Medicine as specialized medical care for people living with serious illness. It focuses on providing relief from the symptoms and stress of a serious illness. The goal is to improve quality of life for both the patient and the family.  Daughter Drinda Butts tells me at baseline patient lives alone and is fully independent. Daughter tells me she had no confusion at baseline. Daughter does share patient is a "picky" eater. Daughter has noticed significant weight loss over the past 6 months. Daughter tells me she has been speaking with patient's sons about concerns over her weight loss.     We discussed patient's current illness and what it means in the larger context of patient's on-going co-morbidities.  Natural disease trajectory and expectations at EOL were discussed. We discussed patient's renal failure. We discussed no improvement so  far.   I attempted to elicit values and goals of care important to the patient.    Daughter tells me she had conversation with patient's sons earlier and prepared them that patient "wouldn't be what she was before". Daughter tells me sons accepted this. They all understand current illness is serious.   Daughter shares with me that patient had family member on HD and as she saw what her family member went through she expressed to her family members she would never want HD. Daughter tells me patient's sons are aware of this as well and support this wish.   We also discussed code status. Daughter tells me patient has shared her wishes about this before and has told her daughter "if I'm going, let me go". Daughter shares patient would not want CPR and would not want to be on a ventilator. Daughter reports sons are aware of these wishes as well. Discussed changing code status to DNR/DNI. Daughter agrees.   Discussed with daughter the importance of continued conversation with family and the medical providers regarding overall plan of care and treatment options, ensuring decisions are within the context of the patient's values and GOCs.    Questions and concerns were addressed. The family was encouraged to call with questions or concerns.    Primary Decision Maker NEXT OF KIN at this point as patient confused    SUMMARY OF RECOMMENDATIONS   - code status change to DNR/DNI per daughter's report of patient's previously expressed wishes - daughter shares patient would never want dialysis - continue current measures, allow time for outcomes - PMT to continue to follow  Code Status/Advance Care Planning: DNR   Additional Recommendations (Limitations, Scope, Preferences):  No Hemodialysis      Primary Diagnoses: Present on Admission:  Acute encephalopathy  Acute kidney injury superimposed on chronic kidney disease (HCC)  GI bleed  Essential hypertension   I have reviewed the medical  record, interviewed the patient and family, and examined the patient. The following aspects are pertinent.  Past Medical History:  Diagnosis Date   Diabetes (HCC)    Hypertension    Rectal mass    Venous insufficiency (chronic) (peripheral)    Social History   Socioeconomic History   Marital status: Divorced    Spouse name: Not on file   Number of children: Not on file   Years of education: Not on file   Highest education level: Not on file  Occupational History   Not on file  Tobacco Use   Smoking status: Every Day   Smokeless tobacco: Never  Substance and Sexual Activity   Alcohol use: Yes   Drug use: No   Sexual activity: Not on file  Other Topics Concern   Not on file  Social History Narrative   Not on file   Social Determinants of Health   Financial Resource Strain: Not on file  Food Insecurity: Not on file  Transportation Needs: Not on file  Physical Activity: Not on file  Stress: Not on file  Social Connections: Not on file   History reviewed. No pertinent family history. Scheduled Meds:  amLODipine  10 mg Oral Daily   carvedilol  12.5 mg Oral BID WC   feeding supplement (GLUCERNA SHAKE)  237 mL Oral TID BM   insulin aspart  0-6 Units Subcutaneous TID WC   pantoprazole  40 mg Oral BID   sodium chloride flush  3 mL Intravenous Q12H   Continuous Infusions:  sodium chloride Stopped (06/20/22 1734)   sodium bicarbonate 75 mEq in sodium chloride 0.45 % 1,075 mL infusion 100 mL/hr at 06/21/22 1018   PRN Meds:.acetaminophen **OR** acetaminophen, hydrALAZINE, ondansetron **OR** ondansetron (ZOFRAN) IV Allergies  Allergen Reactions   Penicillins    Review of Systems  Unable to perform ROS: Mental status change    Physical Exam Constitutional:      General: She is not in acute distress.    Comments: Thin Confused - lying in bed horizontally with legs over side rails when I entered Does not respond to questions Makes eye contact  Pulmonary:      Effort: Pulmonary effort is normal.  Skin:    General: Skin is warm and dry.  Neurological:     Mental Status: She is disoriented.     Vital Signs: BP (!) 150/80 (BP Location: Right Arm)   Pulse 82   Temp 97.9 F (36.6 C) (Axillary)   Resp 18   Ht 5' (1.524 m)   Wt 41.6 kg   SpO2 99%   BMI 17.91 kg/m  Pain Scale: 0-10   Pain Score: 0-No pain   SpO2: SpO2: 99 % O2 Device:SpO2: 99 % O2 Flow Rate: .   IO: Intake/output summary:  Intake/Output Summary (Last 24 hours) at 06/21/2022 1425 Last data filed at 06/20/2022 2155 Gross per 24 hour  Intake 850 ml  Output --  Net 850 ml    LBM: Last BM Date : 06/21/22 Baseline Weight: Weight: 42.9 kg Most recent weight: Weight: 41.6 kg        *Please note that this is a verbal dictation therefore any spelling or grammatical errors are due to the "[employer]" system interpretation.   Fabian November  Andi Devon, DNP, AGNP-C Palliative Medicine Team [phone] Pager: [phone]

## 2022-06-21 NOTE — Progress Notes (Signed)
[facility] Progress Note  Patient: Courtney Holt    QMV:[id number]  DOA: 06/20/2022     Date of Service: the patient was seen and examined on 06/21/2022  Chief Complaint  Patient presents with   Weakness   Brief hospital course: Courtney Holt is a 88 y.o. female with medical history significant of hypertension, HFpEF, CKD stage IV, CVA, hyperlipidemia, type 2 diabetes, who presents to the ED due to altered mental status.  History obtained through chart review given patient's altered mental status. Ed w/up: Hypertensive emergency, BP 208/113, HR 99.  CBC Hb 6.9, bicarb 13 metabolic acidosis, creatinine 2.84 AKI on CKD stage IV. CT head was obtained with no acute or cranial abnormality. FOBT was positive. 1 unit of packed RBCs was transfused. S/p IV labetalol given in the ED.  TR[facility] consulted for admission and further management as below.   Assessment and Plan:  Metabolic encephalopathy could be secondary to renal failure uremic encephalopathy. CT head and MRI brain did not show any acute findings. Continue supportive care and fall precautions.   AKI on CKD stage IV with metabolic acidosis Creatinine 6.54 CO2 13 on admission Started bicarb IV infusion Monitor renal functions and urine output Avoid nephrotoxic medications Use renally dose medications Follow renal sonogram Follow nephrology for further recommendation   Hypertensive emergency Patient presented with a BP 208/113 and AKI, endorgan damage with encephalopathy S/p labetalol IV given in the ED Continue amlodipine 10 mg p.o. daily, Coreg 12.5 mg p.o. twice daily Use IV hydralazine as needed BNP 1834 elevated most likely due to hypertensive emergency, patient is euvolemic. Monitor volume status Monitor BP and titrate medications accordingly  Anemia multifactorial due to GI bleed and CKD stage IV FOBT positive, no obvious bleeding Hb 6.8 on admission, patient received 1 unit of PRBC transfusion.  Posttransfusion Hb  8.7 Discussed with the patient's daughter at bedside who does not want EGD or colonoscopy, would prefer conservative management Monitor H&H and transfuse if Hb less than 7 Iron profile within normal range, folate 8.9 within normal range Check B12 level   Body mass index is 17.91 kg/m.  Interventions:       Diet: Heart healthy diet DVT Prophylaxis: SCD, pharmacological prophylaxis contraindicated due to GI bleeding    Advance goals of care discussion: Full code  Family Communication: family was present at bedside, at the time of interview.  The pt provided permission to discuss medical plan with the family. Opportunity was given to ask question and all questions were answered satisfactorily.   Disposition:  Pt is from [facility], admitted with AMS, Renal Failure and low Hb FOBT +, still has Renal failure on Bicarb gtt, which precludes a safe discharge. Discharge to SNF TBD after PT/OT eval, when clinically stable.  Subjective: No significant events overnight, patient is AAO x 0, unable to offer any complaints, she was eating breakfast and resting comfortably without any distress.  Patient's daughter was at bedside, all question and concerns answered.  Physical Exam: General: NAD, lying comfortably Appear in no distress, affect appropriate Eyes: PERRLA ENT: Oral Mucosa Clear, moist  Neck: no JVD,  Cardiovascular: S1 and S2 Present, no Murmur,  Respiratory: good respiratory effort, Bilateral Air entry equal and Decreased, no Crackles, no wheezes Abdomen: Bowel Sound present, Soft and no tenderness,  Skin: no rashes Extremities: no Pedal edema, no calf tenderness Neurologic: without any new focal findings Gait not checked due to patient safety concerns  Vitals:   06/21/22 0425 06/21/22 0445 06/21/22 0500 06/21/22  0706  BP: (!) 150/98 (!) 149/77  (!) 150/80  Pulse: 84 84  82  Resp: (!) 24 20  18   Temp: 97.8 F (36.6 C) 98.3 F (36.8 C)  97.9 F (36.6 C)  TempSrc:    Axillary   SpO2: 100% 100%  99%  Weight:   41.6 kg   Height:        Intake/Output Summary (Last 24 hours) at 06/21/2022 1200 Last data filed at 06/20/2022 2155 Gross per 24 hour  Intake 850 ml  Output --  Net 850 ml   Filed Weights   06/20/22 2155 06/21/22 0500  Weight: 42.9 kg 41.6 kg    Data Reviewed: I have personally reviewed and interpreted daily labs, tele strips, imagings as discussed above. I reviewed all nursing notes, pharmacy notes, vitals, pertinent old records I have discussed plan of care as described above with RN and patient/family.  CBC: Recent Labs  Lab 06/20/22 1259 06/21/22 0604  WBC 9.3 8.7  NEUTROABS  --  6.3  HGB 6.8* 8.7*  HCT 21.1* 26.3*  MCV 93.8 90.1  PLT 224 214   Basic Metabolic Panel: Recent Labs  Lab 06/20/22 1259 06/21/22 0604 06/21/22 0927  NA 141 145  --   K 4.1 4.6  --   CL 117* 120*  --   CO2 13* 14*  --   GLUCOSE 96 92  --   BUN 52* 56*  --   CREATININE 6.54* 6.51*  --   CALCIUM 7.9* 7.8*  --   MG  --   --  2.5*  PHOS  --   --  5.8*    Studies: MR BRAIN WO CONTRAST  Result Date: 06/20/2022 CLINICAL DATA:  Altered mental status EXAM: MRI HEAD WITHOUT CONTRAST TECHNIQUE: Multiplanar, multiecho pulse sequences of the brain and surrounding structures were obtained without intravenous contrast. COMPARISON:  None Available. FINDINGS: Brain: No acute infarct, mass effect or extra-axial collection. No acute or chronic hemorrhage. There is confluent hyperintense T2-weighted signal within the white matter. Generalized volume loss. Old infarcts of posterior left parietal lobe, right occipital lobe and the cerebellum. The midline structures are normal. Vascular: Major flow voids are preserved. Skull and upper cervical spine: Normal calvarium and skull base. Visualized upper cervical spine and soft tissues are normal. Sinuses/Orbits:Bilateral mastoid fluid.  Normal orbits. IMPRESSION: 1. No acute intracranial abnormality. 2. Old infarcts of  posterior left parietal lobe, right occipital lobe and the cerebellum. Electronically Signed   By: Deatra Robinson M.D.   On: 06/20/2022 23:54   CT Head Wo Contrast  Result Date: 06/20/2022 CLINICAL DATA:  Mental status change, unknown cause EXAM: CT HEAD WITHOUT CONTRAST TECHNIQUE: Contiguous axial images were obtained from the base of the skull through the vertex without intravenous contrast. RADIATION DOSE REDUCTION: This exam was performed according to the departmental dose-optimization program which includes automated exposure control, adjustment of the mA and/or kV according to patient size and/or use of iterative reconstruction technique. COMPARISON:  CT Head 10/24/21 FINDINGS: Brain: There is sequela of severe chronic microvascular ischemic change. No hemorrhage. No extra-axial fluid collection. No hydrocephalus. There are chronic infarcts in the left parietal and right occipital lobes. There is also a chronic infarct in the superior left cerebellum. Mineralization of the basal ganglia on the left. Vascular: No hyperdense vessel or unexpected calcification. Skull: Normal. Negative for fracture or focal lesion. Sinuses/Orbits: Trace bilateral mastoid effusions. No middle ear effusion. Paranasal sinuses are clear. Orbits are unremarkable. Other: None. IMPRESSION: 1.  No acute intracranial abnormality. 2. Sequela of severe chronic microvascular ischemic change. Chronic infarcts in the left parietal lobe, right occipital lobe, and left superior cerebellum Electronically Signed   By: Lorenza Cambridge M.D.   On: 06/20/2022 16:33    Scheduled Meds:  amLODipine  10 mg Oral Daily   carvedilol  12.5 mg Oral BID WC   feeding supplement (GLUCERNA SHAKE)  237 mL Oral TID BM   insulin aspart  0-6 Units Subcutaneous TID WC   pantoprazole  40 mg Oral BID   sodium chloride flush  3 mL Intravenous Q12H   Continuous Infusions:  sodium chloride Stopped (06/20/22 1734)   sodium bicarbonate 75 mEq in sodium chloride 0.45  % 1,075 mL infusion 100 mL/hr at 06/21/22 1018   PRN Meds: acetaminophen **OR** acetaminophen, hydrALAZINE, ondansetron **OR** ondansetron (ZOFRAN) IV  Time spent: 50 minutes  Author: Gillis Santa. MD [facility] 06/21/2022 12:00 PM  To reach On-call, see care teams to locate the attending and reach out to them via [email or web address]. If 7PM-7AM, please contact night-coverage If you still have difficulty reaching the attending provider, please page the [facility] (Director on Call) for [facility] on amion for assistance.

## 2022-06-21 NOTE — Progress Notes (Signed)
Patient is alert an oriented x1 to person. Patient is restless, agitated and does not follow safety directions. Had 3 small bowel movements throughout the night. She is a stand and pivot to [facility]. Upon skin assessment, noticed an irritated bulge from rectum that I initially suspected it to be hemorrhoids or a prolapse but it looked strange. Contacted the on call hospitalist who came into room to assess. Found out that it is a rectal mass. She is currently having small -moderate serosanguineous drainage from the mass due to irritated areas. Suspect that patient is having blood loss from this area over time-hgb was 6.8 and she had received 1UPRBCs in ED. Patient is hight fall risk. Admission assessment incomplete due to patient's confusion. Patient denied pain.

## 2022-06-21 NOTE — Progress Notes (Signed)
[facility]  ROUNDING NOTE   Subjective:   Courtney Holt is a 88 y.o. female with past medical history including hypertension, type 2 diabetes, iron deficiency anemia and chronic kidney disease stage IV.  Patient presents to the emergency department with family complaining of weakness and has been admitted for Acute encephalopathy [G93.40] AKI (acute kidney injury) (HCC) [N17.9] Anemia, unspecified type [D64.9]  Patient is known to our practice from a previous admission and was lost to follow-up after discharge.  Patient is seen resting quietly in bed with covers over her head.  Daughter at bedside.  Daughter states patient lives alone and since previous admission has been doing well.  States patient was able to complete ADLs, cleaning her home and prepare her own meals independently.  Daughter states that patient began to decline over the past week.  Experiencing loss of appetite and confusion.  Labs on ED arrival significant for serum bicarb 13, BUN 52, creatinine 6.54 with GFR 6, calcium 7.9, and hemoglobin 6.8.  Patient did receive blood transfusion.  CT head negative for acute findings but positive for severe chronic microvascular ischemic changes.  MRI of the brain also negative for acute findings.  We have been consulted to evaluate acute kidney injury.  Objective:  Vital signs in last 24 hours:  Temp:  [97.8 F (36.6 C)-98.5 F (36.9 C)] 97.9 F (36.6 C) (05/23 0706) Pulse Rate:  [71-84] 82 (05/23 0706) Resp:  [17-26] 18 (05/23 0706) BP: (149-196)/(77-107) 150/80 (05/23 0706) SpO2:  [92 %-100 %] 99 % (05/23 0706) Weight:  [41.6 kg-42.9 kg] 41.6 kg (05/23 0500)  Weight change:  Filed Weights   06/20/22 2155 06/21/22 0500  Weight: 42.9 kg 41.6 kg    Intake/Output: I/O last 3 completed shifts: In: 850 [Blood:350; IV Piggyback:500] Out: -    Intake/Output this shift:  No intake/output data recorded.  Physical Exam: General: NAD  Head: Normocephalic,  atraumatic.  Dry oral mucosal membranes  Eyes: Anicteric  Lungs:  Clear to auscultation, normal effort, room air  Heart: Regular rate and rhythm  Abdomen:  Soft, nontender  Extremities: No peripheral edema.  Neurologic: Alert and oriented to self, moving all four extremities  Skin: No lesions  Access: None    Basic Metabolic Panel: Recent Labs  Lab 06/20/22 1259 06/21/22 0604 06/21/22 0927  NA 141 145  --   K 4.1 4.6  --   CL 117* 120*  --   CO2 13* 14*  --   GLUCOSE 96 92  --   BUN 52* 56*  --   CREATININE 6.54* 6.51*  --   CALCIUM 7.9* 7.8*  --   MG  --   --  2.5*  PHOS  --   --  5.8*    Liver Function Tests: No results for input(s): "AST", "ALT", "ALKPHOS", "BILITOT", "PROT", "ALBUMIN" in the last 168 hours. No results for input(s): "LIPASE", "AMYLASE" in the last 168 hours. No results for input(s): "AMMONIA" in the last 168 hours.  CBC: Recent Labs  Lab 06/20/22 1259 06/21/22 0604  WBC 9.3 8.7  NEUTROABS  --  6.3  HGB 6.8* 8.7*  HCT 21.1* 26.3*  MCV 93.8 90.1  PLT 224 214    Cardiac Enzymes: No results for input(s): "CKTOTAL", "CKMB", "CKMBINDEX", "TROPONINI" in the last 168 hours.  BNP: Invalid input(s): "POCBNP"  CBG: Recent Labs  Lab 06/20/22 2209 06/21/22 0742 06/21/22 1554  GLUCAP 113* 80 159*    Microbiology: Results for orders placed or performed  during the hospital encounter of 10/24/21  Culture, blood (routine x 2)     Status: None   Collection Time: 10/24/21  5:00 PM   Specimen: BLOOD  Result Value Ref Range Status   Specimen Description BLOOD LEFT ANTECUBITAL  Final   Special Requests   Final    BOTTLES DRAWN AEROBIC AND ANAEROBIC Blood Culture results may not be optimal due to an excessive volume of blood received in culture bottles   Culture   Final    NO GROWTH 5 DAYS Performed at [facility], [address] [place], [place] [address]    Report Status 10/29/2021 FINAL  Final  Resp Panel by RT-PCR (Flu A&B,  Covid) Anterior Nasal Swab     Status: None   Collection Time: 10/24/21  5:00 PM   Specimen: Anterior Nasal Swab  Result Value Ref Range Status   SARS Coronavirus 2 by RT PCR NEGATIVE NEGATIVE Final    Comment: (NOTE) SARS-CoV-2 target nucleic acids are NOT DETECTED.  The SARS-CoV-2 RNA is generally detectable in upper respiratory specimens during the acute phase of infection. The lowest concentration of SARS-CoV-2 viral copies this assay can detect is 138 copies/mL. A negative result does not preclude SARS-Cov-2 infection and should not be used as the sole basis for treatment or other patient management decisions. A negative result may occur with  improper specimen collection/handling, submission of specimen other than nasopharyngeal swab, presence of viral mutation(s) within the areas targeted by this assay, and inadequate number of viral copies(<138 copies/mL). A negative result must be combined with clinical observations, patient history, and epidemiological information. The expected result is Negative.  Fact Sheet for Patients:  BloggerCourse.com  Fact Sheet for Healthcare Providers:  SeriousBroker.it  This test is no t yet approved or cleared by the [place] FDA and  has been authorized for detection and/or diagnosis of SARS-CoV-2 by FDA under an Emergency Use Authorization (EUA). This EUA will remain  in effect (meaning this test can be used) for the duration of the COVID-19 declaration under Section 564(b)(1) of the Act, 21 U.S.C.section 360bbb-3(b)(1), unless the authorization is terminated  or revoked sooner.       Influenza A by PCR NEGATIVE NEGATIVE Final   Influenza B by PCR NEGATIVE NEGATIVE Final    Comment: (NOTE) The Xpert Xpress SARS-CoV-2/FLU/RSV plus assay is intended as an aid in the diagnosis of influenza from Nasopharyngeal swab specimens and should not be used as a sole basis for treatment. Nasal  washings and aspirates are unacceptable for Xpert Xpress SARS-CoV-2/FLU/RSV testing.  Fact Sheet for Patients: BloggerCourse.com  Fact Sheet for Healthcare Providers: SeriousBroker.it  This test is not yet approved or cleared by the [place] FDA and has been authorized for detection and/or diagnosis of SARS-CoV-2 by FDA under an Emergency Use Authorization (EUA). This EUA will remain in effect (meaning this test can be used) for the duration of the COVID-19 declaration under Section 564(b)(1) of the Act, 21 U.S.C. section 360bbb-3(b)(1), unless the authorization is terminated or revoked.  Performed at [facility], [address] [place], [place] [address]   Culture, blood (routine x 2)     Status: None   Collection Time: 10/24/21  6:00 PM   Specimen: BLOOD  Result Value Ref Range Status   Specimen Description BLOOD LEFT ANTECUBITAL  Final   Special Requests   Final    BOTTLES DRAWN AEROBIC AND ANAEROBIC Blood Culture results may not be optimal due to an excessive volume  of blood received in culture bottles   Culture   Final    NO GROWTH 5 DAYS Performed at [facility], [address] [place], [place] [address]    Report Status 10/29/2021 FINAL  Final    Coagulation Studies: No results for input(s): "LABPROT", "INR" in the last 72 hours.  Urinalysis: No results for input(s): "COLORURINE", "LABSPEC", "PHURINE", "GLUCOSEU", "HGBUR", "BILIRUBINUR", "KETONESUR", "PROTEINUR", "UROBILINOGEN", "NITRITE", "LEUKOCYTESUR" in the last 72 hours.  Invalid input(s): "APPERANCEUR"    Imaging: US RENAL  Result Date: 06/21/2022 CLINICAL DATA:  [id number] Acute kidney failure (HCC) [id number] EXAM: RENAL / URINARY TRACT ULTRASOUND COMPLETE COMPARISON:  Ultrasound 10/26/2021 FINDINGS: Right Kidney: Renal measurements: 8.4 x 4.2 x 3.9 cm = volume: There is 71.3. mL. Increased renal cortical echogenicity with renal  cortical thinning. No hydronephrosis. Simple cysts measuring 1.2 and 0.9 cm which require no follow-up imaging. Left Kidney: Renal measurements: 8.9 x 4.8 x 3.3 cm = volume: There is 72.7 mL. Increased renal cortical echogenicity with renal cortical thinning. No hydronephrosis. There is a simple cyst measuring 2.4 and a minimally complex 1.2 cm cyst which require no follow-up imaging. Bladder: Appears normal for degree of bladder distention. Other: None. IMPRESSION: No hydronephrosis. Echogenic kidneys with cortical thinning compatible with medical renal disease. Electronically Signed   By: Caprice Renshaw M.D.   On: 06/21/2022 12:41   MR BRAIN WO CONTRAST  Result Date: 06/20/2022 CLINICAL DATA:  Altered mental status EXAM: MRI HEAD WITHOUT CONTRAST TECHNIQUE: Multiplanar, multiecho pulse sequences of the brain and surrounding structures were obtained without intravenous contrast. COMPARISON:  None Available. FINDINGS: Brain: No acute infarct, mass effect or extra-axial collection. No acute or chronic hemorrhage. There is confluent hyperintense T2-weighted signal within the white matter. Generalized volume loss. Old infarcts of posterior left parietal lobe, right occipital lobe and the cerebellum. The midline structures are normal. Vascular: Major flow voids are preserved. Skull and upper cervical spine: Normal calvarium and skull base. Visualized upper cervical spine and soft tissues are normal. Sinuses/Orbits:Bilateral mastoid fluid.  Normal orbits. IMPRESSION: 1. No acute intracranial abnormality. 2. Old infarcts of posterior left parietal lobe, right occipital lobe and the cerebellum. Electronically Signed   By: Deatra Robinson M.D.   On: 06/20/2022 23:54   CT Head Wo Contrast  Result Date: 06/20/2022 CLINICAL DATA:  Mental status change, unknown cause EXAM: CT HEAD WITHOUT CONTRAST TECHNIQUE: Contiguous axial images were obtained from the base of the skull through the vertex without intravenous contrast.  RADIATION DOSE REDUCTION: This exam was performed according to the departmental dose-optimization program which includes automated exposure control, adjustment of the mA and/or kV according to patient size and/or use of iterative reconstruction technique. COMPARISON:  CT Head 10/24/21 FINDINGS: Brain: There is sequela of severe chronic microvascular ischemic change. No hemorrhage. No extra-axial fluid collection. No hydrocephalus. There are chronic infarcts in the left parietal and right occipital lobes. There is also a chronic infarct in the superior left cerebellum. Mineralization of the basal ganglia on the left. Vascular: No hyperdense vessel or unexpected calcification. Skull: Normal. Negative for fracture or focal lesion. Sinuses/Orbits: Trace bilateral mastoid effusions. No middle ear effusion. Paranasal sinuses are clear. Orbits are unremarkable. Other: None. IMPRESSION: 1. No acute intracranial abnormality. 2. Sequela of severe chronic microvascular ischemic change. Chronic infarcts in the left parietal lobe, right occipital lobe, and left superior cerebellum Electronically Signed   By: Lorenza Cambridge M.D.   On: 06/20/2022 16:33     Medications:  sodium chloride Stopped (06/20/22 1734)   sodium bicarbonate 75 mEq in sodium chloride 0.45 % 1,075 mL infusion 100 mL/hr at 06/21/22 1018    amLODipine  10 mg Oral Daily   carvedilol  12.5 mg Oral BID WC   feeding supplement (GLUCERNA SHAKE)  237 mL Oral TID BM   insulin aspart  0-6 Units Subcutaneous TID WC   pantoprazole  40 mg Oral BID   sodium chloride flush  3 mL Intravenous Q12H   acetaminophen **OR** acetaminophen, hydrALAZINE, ondansetron **OR** ondansetron (ZOFRAN) IV  Assessment/ Plan:  Ms. Courtney Holt is a 88 y.o.  female  with past medical history including hypertension, type 2 diabetes, iron deficiency anemia and chronic kidney disease stage IV.  Patient presents to the emergency department with family complaining of weakness and  has been admitted for Acute encephalopathy [G93.40] AKI (acute kidney injury) (HCC) [N17.9] Anemia, unspecified type [D64.9]   Acute Kidney Injury on chronic kidney disease stage 4 with baseline creatinine 3.16 and GFR of 14 on 10/27/2021.  Acute kidney injury secondary to suspected hypovolemia Chronic kidney disease is secondary to advanced age, hypertension, diabetes.  Renal ultrasound negative for hydronephrosis, does show echogenic kidneys.  Agree with IV fluids.  Continue to avoid nephrotoxic agents and therapies.  Discussed with daughter who states they would not pursue dialysis options for their mother.  Will recommend palliative care consult for goals of care.   Lab Results  Component Value Date   CREATININE 6.51 (H) 06/21/2022   CREATININE 6.54 (H) 06/20/2022   CREATININE 3.16 (H) 10/27/2021    Intake/Output Summary (Last 24 hours) at 06/21/2022 1632 Last data filed at 06/20/2022 2155 Gross per 24 hour  Intake 850 ml  Output --  Net 850 ml    2.  Acute metabolic acidosis.  Serum bicarb 13.  Agree with sodium bicarb infusion.  3. . Anemia of chronic kidney disease Lab Results  Component Value Date   HGB 8.7 (L) 06/21/2022  Hemoglobin below desired range however improved from admission.  Patient has received blood transfusion during this admission.  4.  Hypertension with chronic kidney disease.  Home regimen includes amlodipine and carvedilol.  Currently receiving these medications.  5. Secondary Hyperparathyroidism: with outpatient labs: None Lab Results  Component Value Date   CALCIUM 7.8 (L) 06/21/2022   PHOS 5.8 (H) 06/21/2022    Patient prescribed cholecalciferol outpatient.  Will continue to monitor bone minerals during this admission.   LOS: 1   5/23/20244:32 PM

## 2022-06-21 NOTE — Progress Notes (Signed)
       CROSS COVER NOTE  NAME: Courtney Holt MRN: [id number] DOB : [birth year]    HPI/Events of Note   Report:request for hemorrhoidal creme for what appeared to be hemorrhoids protruding from anus  On review of chart:patient with history of rectal mass described as "stable and present fr years in [facility] note in EPIC from 2019    Assessment and  Interventions   Assessment: External rectal mass that appears red an irritated as well as friable.  Plan: Nurse instructed to use protective ointment/skin barrier Day attending to determine further eval/workup/treatment       Donnie Mesa NP [facility] Cross Cover 7pm-7am - check amion for availability Pager [phone]

## 2022-06-22 DIAGNOSIS — D649 Anemia, unspecified: Secondary | ICD-10-CM | POA: Diagnosis not present

## 2022-06-22 DIAGNOSIS — Z7189 Other specified counseling: Secondary | ICD-10-CM | POA: Diagnosis not present

## 2022-06-22 DIAGNOSIS — N179 Acute kidney failure, unspecified: Secondary | ICD-10-CM | POA: Diagnosis not present

## 2022-06-22 DIAGNOSIS — G9340 Encephalopathy, unspecified: Secondary | ICD-10-CM | POA: Diagnosis not present

## 2022-06-22 LAB — BASIC METABOLIC PANEL
Anion gap: 8 (ref 5–15)
BUN: 57 mg/dL — ABNORMAL HIGH (ref 8–23)
CO2: 17 mmol/L — ABNORMAL LOW (ref 22–32)
Calcium: 7.6 mg/dL — ABNORMAL LOW (ref 8.9–10.3)
Chloride: 118 mmol/L — ABNORMAL HIGH (ref 98–111)
Creatinine, Ser: 6.33 mg/dL — ABNORMAL HIGH (ref 0.44–1.00)
GFR, Estimated: 6 mL/min — ABNORMAL LOW (ref >60)
Glucose, Bld: 95 mg/dL (ref 70–99)
Potassium: 3.9 mmol/L (ref 3.5–5.1)
Sodium: 143 mmol/L (ref 135–145)

## 2022-06-22 LAB — GLUCOSE, CAPILLARY
Glucose-Capillary: 85 mg/dL (ref 70–99)
Glucose-Capillary: 108 mg/dL — ABNORMAL HIGH (ref 70–99)
Glucose-Capillary: 94 mg/dL (ref 70–99)
Glucose-Capillary: 216 mg/dL — ABNORMAL HIGH (ref 70–99)

## 2022-06-22 LAB — CBC
HCT: 22.6 % — ABNORMAL LOW (ref 36.0–46.0)
Hemoglobin: 7.6 g/dL — ABNORMAL LOW (ref 12.0–15.0)
MCH: 29.6 pg (ref 26.0–34.0)
MCHC: 33.6 g/dL (ref 30.0–36.0)
MCV: 87.9 fL (ref 80.0–100.0)
Platelets: 187 10*3/uL (ref 150–400)
RBC: 2.57 MIL/uL — ABNORMAL LOW (ref 3.87–5.11)
RDW: 15.9 % — ABNORMAL HIGH (ref 11.5–15.5)
WBC: 7.8 10*3/uL (ref 4.0–10.5)
nRBC: 0 % (ref 0.0–0.2)

## 2022-06-22 LAB — HEMOGLOBIN A1C
Hgb A1c MFr Bld: 5.6 % (ref 4.8–5.6)
Mean Plasma Glucose: 114 mg/dL

## 2022-06-22 LAB — PHOSPHORUS: Phosphorus: 5.8 mg/dL — ABNORMAL HIGH (ref 2.5–4.6)

## 2022-06-22 LAB — MAGNESIUM: Magnesium: 2.5 mg/dL — ABNORMAL HIGH (ref 1.7–2.4)

## 2022-06-22 MED ORDER — NICOTINE 21 MG/24HR TD PT24
21.0000 mg | MEDICATED_PATCH | Freq: Every day | TRANSDERMAL | Status: DC
  Administered 2022-06-22 – 2022-06-25 (×4): 21 mg via TRANSDERMAL
  Filled 2022-06-22 (×5): qty 1

## 2022-06-22 MED ORDER — HYDRALAZINE HCL 20 MG/ML IJ SOLN: 10.0000 mg | Freq: Three times a day (TID) | INTRAMUSCULAR | Status: DC | PRN

## 2022-06-22 MED ORDER — KETOTIFEN FUMARATE 0.035 % OP SOLN
1.0000 [drp] | Freq: Two times a day (BID) | OPHTHALMIC | Status: DC
  Administered 2022-06-22 – 2022-06-25 (×7): 1 [drp] via OPHTHALMIC
  Filled 2022-06-22: qty 5

## 2022-06-22 MED ORDER — POLYVINYL ALCOHOL 1.4 % OP SOLN
1.0000 [drp] | Freq: Two times a day (BID) | OPHTHALMIC | Status: DC
  Administered 2022-06-22 – 2022-06-25 (×7): 1 [drp] via OPHTHALMIC
  Filled 2022-06-22: qty 15

## 2022-06-22 NOTE — [place] FL2 (Signed)
[place] MEDICAID FL2 LEVEL OF CARE FORM     IDENTIFICATION  Patient Name: Courtney Holt Birthdate: Sep 16, 1934 Sex: female Admission Date (Current Location): 06/20/2022  [facility] and [place] Number:  Chiropodist and Address:  [facility], [address] [place], [place] [address]      Provider Number: [id number]  Attending Physician Name and Address:  Gillis Santa, MD  Relative Name and Phone Number:  Rhetta Mura (Daughter) [phone]    Current Level of Care: Hospital Recommended Level of Care: Skilled Nursing Facility Prior Approval Number:    Date Approved/Denied:   PASRR Number: [id number]  Discharge Plan: SNF    Current Diagnoses: Patient Active Problem List   Diagnosis Date Noted   Acute encephalopathy 06/20/2022   Acute on chronic anemia 06/20/2022   (HFpEF) heart failure with preserved ejection fraction (HCC) 06/20/2022   Altered mental status    Acute metabolic encephalopathy 10/25/2021   GI bleed 10/25/2021   Hypoglycemia 10/24/2021   Essential hypertension 10/24/2021   Dyslipidemia 10/24/2021   History of CVA (cerebrovascular accident) 10/24/2021   Diabetes mellitus type 2, controlled, without complications (HCC) 10/24/2021   Acute kidney injury superimposed on chronic kidney disease (HCC) 10/24/2021   Pleural effusion due to CHF (congestive heart failure) (HCC) 10/24/2021    Orientation RESPIRATION BLADDER Height & Weight     Self  Normal Incontinent, External catheter Weight: 91 lb 11.4 oz (41.6 kg) Height:  5' (152.4 cm)  BEHAVIORAL SYMPTOMS/MOOD NEUROLOGICAL BOWEL NUTRITION STATUS      Incontinent Diet (DYS 3)  AMBULATORY STATUS COMMUNICATION OF NEEDS Skin   Extensive Assist Verbally Normal                       Personal Care Assistance Level of Assistance  Bathing, Feeding, Dressing Bathing Assistance: Maximum assistance Feeding assistance: Maximum assistance Dressing Assistance: Maximum  assistance     Functional Limitations Info  Sight, Hearing, Speech Sight Info: Impaired Hearing Info: Impaired Speech Info: Impaired    SPECIAL CARE FACTORS FREQUENCY  PT (By licensed PT), OT (By licensed OT)     PT Frequency: 5 times a week OT Frequency: 5 times a week            Contractures Contractures Info: Not present    Additional Factors Info  Code Status, Allergies Code Status Info: DNR Allergies Info: Penicillins           Current Medications (06/22/2022):  This is the current hospital active medication list Current Facility-Administered Medications  Medication Dose Route Frequency Provider Last Rate Last Admin   0.9 %  sodium chloride infusion  10 mL/hr Intravenous Once Dionne Bucy, MD   Held at 06/20/22 1734   acetaminophen (TYLENOL) tablet 650 mg  650 mg Oral Q6H PRN Verdene Lennert, MD       Or   acetaminophen (TYLENOL) suppository 650 mg  650 mg Rectal Q6H PRN Verdene Lennert, MD       amLODipine (NORVASC) tablet 10 mg  10 mg Oral Daily Verdene Lennert, MD   10 mg at 06/22/22 1029   carvedilol (COREG) tablet 12.5 mg  12.5 mg Oral BID WC Verdene Lennert, MD   12.5 mg at 06/22/22 1029   feeding supplement (GLUCERNA SHAKE) (GLUCERNA SHAKE) liquid 237 mL  237 mL Oral TID BM Gillis Santa, MD   237 mL at 06/22/22 1028   hydrALAZINE (APRESOLINE) injection 10 mg  10 mg Intravenous Q8H PRN Gillis Santa, MD  insulin aspart (novoLOG) injection 0-6 Units  0-6 Units Subcutaneous TID WC Verdene Lennert, MD   1 Units at 06/21/22 1608   ketotifen (ZADITOR) 0.035 % ophthalmic solution 1 drop  1 drop Both Eyes BID Gillis Santa, MD       nicotine (NICODERM CQ - dosed in mg/24 hours) patch 21 mg  21 mg Transdermal Daily Gillis Santa, MD   21 mg at 06/22/22 1030   ondansetron (ZOFRAN) tablet 4 mg  4 mg Oral Q6H PRN Verdene Lennert, MD       Or   ondansetron (ZOFRAN) injection 4 mg  4 mg Intravenous Q6H PRN Verdene Lennert, MD       pantoprazole (PROTONIX) EC  tablet 40 mg  40 mg Oral BID Gillis Santa, MD   40 mg at 06/22/22 1029   polyvinyl alcohol (LIQUIFILM TEARS) 1.4 % ophthalmic solution 1 drop  1 drop Both Eyes BID Gillis Santa, MD       sodium bicarbonate 75 mEq in sodium chloride 0.45 % 1,075 mL infusion   Intravenous Continuous Gillis Santa, MD 100 mL/hr at 06/21/22 1018 New Bag at 06/21/22 1018   sodium chloride flush (NS) 0.9 % injection 3 mL  3 mL Intravenous Q12H Verdene Lennert, MD   3 mL at 06/22/22 1029     Discharge Medications: Please see discharge summary for a list of discharge medications.  Relevant Imaging Results:  Relevant Lab Results:   Additional Information SS# [id number]  Allena Katz, LCSW

## 2022-06-22 NOTE — Progress Notes (Signed)
[facility] Progress Note  Patient: Courtney Holt    ZOX:[id number]  DOA: 06/20/2022     Date of Service: the patient was seen and examined on 06/22/2022  Chief Complaint  Patient presents with   Weakness   Brief hospital course: Courtney Holt is a 88 y.o. female with medical history significant of hypertension, HFpEF, CKD stage IV, CVA, hyperlipidemia, type 2 diabetes, who presents to the ED due to altered mental status.  History obtained through chart review given patient's altered mental status. Ed w/up: Hypertensive emergency, BP 208/113, HR 99.  CBC Hb 6.9, bicarb 13 metabolic acidosis, creatinine 8.11 AKI on CKD stage IV. CT head was obtained with no acute or cranial abnormality. FOBT was positive. 1 unit of packed RBCs was transfused. S/p IV labetalol given in the ED.  TRH consulted for admission and further management as below.   Assessment and Plan:  Metabolic encephalopathy could be secondary to renal failure uremic encephalopathy. CT head and MRI brain did not show any acute findings. Continue supportive care and fall precautions.   AKI on CKD stage IV with metabolic acidosis Creatinine 6.54 and  CO2 13 on admission Started bicarb IV infusion Monitor renal functions and urine output Avoid nephrotoxic medications Use renally dose medications US renal: No hydronephrosis. Echogenic kidneys with cortical thinning compatible with medical renal disease. Nephrology consulted Cr 6.33 and Co 17 slightly better   Hypertensive emergency Patient presented with a BP 208/113 and AKI, endorgan damage with encephalopathy S/p labetalol IV given in the ED Continue amlodipine 10 mg p.o. daily, Coreg 12.5 mg p.o. twice daily Use IV hydralazine as needed BNP 1834 elevated most likely due to hypertensive emergency, patient is euvolemic. Monitor volume status Monitor BP and titrate medications accordingly  Anemia multifactorial due to GI bleed and CKD stage IV FOBT positive, no  obvious bleeding Hb 6.8 on admission, patient received 1 unit of PRBC transfusion.  Posttransfusion Hb 8.7 Discussed with the patient's daughter at bedside who does not want EGD or colonoscopy, would prefer conservative management Monitor H&H and transfuse if Hb less than 7 Iron profile within normal range, folate 8.9 wnl and B12 level 1227 over the NL Hb 7.6  Conjunctival injection, could be due to dry eyes versus allergies Less likely conjunctivitis Started ketotifen and artificial tears twice daily for 5 days  Body mass index is 17.91 kg/m.  Interventions:    Diet: Heart healthy diet DVT Prophylaxis: SCD, pharmacological prophylaxis contraindicated due to GI bleeding    Advance goals of care discussion: Full code  Family Communication: family was present at bedside, at the time of interview.  The pt provided permission to discuss medical plan with the family. Opportunity was given to ask question and all questions were answered satisfactorily.   Disposition:  Pt is from [facility], admitted with AMS, Renal Failure and low Hb FOBT +, still has Renal failure on Bicarb gtt, which precludes a safe discharge. Discharge to SNF TBD after PT/OT eval, when clinically stable.  Subjective: No significant events overnight, patient still has altered mental status, she is able to tell her nickname, but does not know her real name, no any other complaints, patient was sitting comfortably on the recliner.  Family was at bedside, all question and concerns answered.  They were pointing towards her right eyes, no any other active issues.   Physical Exam: General: NAD, lying comfortably Appear in no distress, affect appropriate Eyes: PERRLA, conjunctival injection noticed more in the right eye than left ENT:  Oral Mucosa Clear, moist  Neck: no JVD,  Cardiovascular: S1 and S2 Present, no Murmur,  Respiratory: good respiratory effort, Bilateral Air entry equal and Decreased, no Crackles, no  wheezes Abdomen: Bowel Sound present, Soft and no tenderness,  Skin: no rashes Extremities: no Pedal edema, no calf tenderness Neurologic: without any new focal findings Gait not checked due to patient safety concerns  Vitals:   06/21/22 1651 06/21/22 1935 06/22/22 0750 06/22/22 1131  BP: (!) 150/78 (!) 163/105 (!) 152/96 (!) 157/74  Pulse: 79 83 87 77  Resp: 19 18 16 16   Temp: 97.8 F (36.6 C) 98.6 F (37 C) 98 F (36.7 C) 97.8 F (36.6 C)  TempSrc: Axillary     SpO2: 100% 96% 100% 100%  Weight:      Height:        Intake/Output Summary (Last 24 hours) at 06/22/2022 1237 Last data filed at 06/22/2022 1039 Gross per 24 hour  Intake 864.23 ml  Output 151 ml  Net 713.23 ml   Filed Weights   06/20/22 2155 06/21/22 0500  Weight: 42.9 kg 41.6 kg    Data Reviewed: I have personally reviewed and interpreted daily labs, tele strips, imagings as discussed above. I reviewed all nursing notes, pharmacy notes, vitals, pertinent old records I have discussed plan of care as described above with RN and patient/family.  CBC: Recent Labs  Lab 06/20/22 1259 06/21/22 0604 06/22/22 0515  WBC 9.3 8.7 7.8  NEUTROABS  --  6.3  --   HGB 6.8* 8.7* 7.6*  HCT 21.1* 26.3* 22.6*  MCV 93.8 90.1 87.9  PLT 224 214 187   Basic Metabolic Panel: Recent Labs  Lab 06/20/22 1259 06/21/22 0604 06/21/22 0927 06/22/22 0515  NA 141 145  --  143  K 4.1 4.6  --  3.9  CL 117* 120*  --  118*  CO2 13* 14*  --  17*  GLUCOSE 96 92  --  95  BUN 52* 56*  --  57*  CREATININE 6.54* 6.51*  --  6.33*  CALCIUM 7.9* 7.8*  --  7.6*  MG  --   --  2.5* 2.5*  PHOS  --   --  5.8* 5.8*    Studies: No results found.  Scheduled Meds:  amLODipine  10 mg Oral Daily   carvedilol  12.5 mg Oral BID WC   feeding supplement (GLUCERNA SHAKE)  237 mL Oral TID BM   insulin aspart  0-6 Units Subcutaneous TID WC   nicotine  21 mg Transdermal Daily   pantoprazole  40 mg Oral BID   sodium chloride flush  3 mL  Intravenous Q12H   Continuous Infusions:  sodium chloride Stopped (06/20/22 1734)   sodium bicarbonate 75 mEq in sodium chloride 0.45 % 1,075 mL infusion 100 mL/hr at 06/21/22 1018   PRN Meds: acetaminophen **OR** acetaminophen, hydrALAZINE, ondansetron **OR** ondansetron (ZOFRAN) IV  Time spent: 50 minutes  Author: Gillis Santa. MD [facility] 06/22/2022 12:37 PM  To reach On-call, see care teams to locate the attending and reach out to them via [email or web address]. If 7PM-7AM, please contact night-coverage If you still have difficulty reaching the attending provider, please page the [facility] (Director on Call) for [facility] on amion for assistance.

## 2022-06-22 NOTE — TOC Initial Note (Signed)
Transition of Care [facility]) - Initial/Assessment Note    Patient Details  Name: Courtney Holt MRN: [id number] Date of Birth: 1934-03-15  [facility]) CM/SW Contact:    Allena Katz, LCSW Phone Number: 06/22/2022, 12:50 PM  Clinical Narrative:                  CSW spoke with patients daughter Anette about SNF. Anette reports she lives closest to [facility] and would like her to go there. She reports pt has not went to rehab before in the past. CSW to send referral for rehab to [facility].  Expected Discharge Plan: Skilled Nursing Facility     Patient Goals and CMS Choice   CMS Medicare.gov Compare Post Acute Care list provided to:: Patient Represenative (must comment) (anette) Choice offered to / list presented to : [facility] POA / Guardian      Expected Discharge Plan and Services                                              Prior Living Arrangements/Services   Lives with:: Self          Need for Family Participation in Patient Care: Yes (Comment) Care giver support system in place?: Yes (comment)      Activities of Daily Living      Permission Sought/Granted                  Emotional Assessment       Orientation: : Fluctuating Orientation (Suspected and/or reported Sundowners)      Admission diagnosis:  Acute encephalopathy [G93.40] AKI (acute kidney injury) (HCC) [N17.9] Anemia, unspecified type [D64.9] Patient Active Problem List   Diagnosis Date Noted   Acute encephalopathy 06/20/2022   Acute on chronic anemia 06/20/2022   (HFpEF) heart failure with preserved ejection fraction (HCC) 06/20/2022   Altered mental status    Acute metabolic encephalopathy 10/25/2021   GI bleed 10/25/2021   Hypoglycemia 10/24/2021   Essential hypertension 10/24/2021   Dyslipidemia 10/24/2021   History of CVA (cerebrovascular accident) 10/24/2021   Diabetes mellitus type 2, controlled, without complications (HCC) 10/24/2021    Acute kidney injury superimposed on chronic kidney disease (HCC) 10/24/2021   Pleural effusion due to CHF (congestive heart failure) (HCC) 10/24/2021   PCP:  [facility] Pharmacy:   [facility] [place] [place] - [address] [place] [address] Phone: [phone] Fax: [phone]     Social Determinants of Health (SDOH) Social History: SDOH Screenings   Tobacco Use: High Risk (06/21/2022)   SDOH Interventions:     Readmission Risk Interventions     No data to display

## 2022-06-22 NOTE — Progress Notes (Signed)
PT Cancellation Note  Patient Details Name: Courtney Holt MRN: [id number] DOB: 1934-05-07   Cancelled Treatment:    Reason Eval/Treat Not Completed: Fatigue/lethargy limiting ability to participate. Pt received upright in bed sleeping. Attempts made to mobilize pt encouraging OOB mobility to eat lunch. Pt awakens easily to voice agreeable to participate but once covers are removed pt quick to become agitated requesting to be covered back up. Educated pt on importance of OOB mobility for strength and return to independence but pt not receptive to education continuing to decline PT. Will re-attempt at a later time/date.    Delphia Grates. Fairly IV, PT, DPT Physical Therapist- [place]  [facility]  06/22/2022, 1:43 PM

## 2022-06-22 NOTE — Progress Notes (Signed)
Daily Progress Note   Patient Name: Courtney Holt       Date: 06/22/2022 DOB: 11/15/1934  Age: 88 y.o. MRN#: [id number] Attending Physician: Gillis Santa, MD Primary Care Physician: [facility] Admit Date: 06/20/2022  Reason for Consultation/Follow-up: Establishing goals of care  HPI/Brief Hospital Review: 88 y.o. female  with past medical history of hypertension, HFpEF, CKD stage IV, CVA, hyperlipidemia, and type 2 diabetes admitted from home on 06/20/2022 with AMS. Found to have hypertensive emergency  as well as creatinine 6.53 on admission. Mental status attributed to renal failure. Also with multifactorial anemia - received 1 unit PRBCs. PMT consulted to discuss GOC.   Palliative Medicine consulted for assisting with goals of care conversations.  Subjective: Extensive chart review has been completed prior to meeting patient including labs, vital signs, imaging, progress notes, orders, and available advanced directive documents from current and previous encounters.    Visited with Courtney Holt at her bedside. Awake and alert, sitting up in chair. Remains pleasantly confused and unable to participate in GOC conversations. Daughter-[name] at bedside during time of visit.  Courtney Holt reports no significant changes or events overnight, worsening confusion noted through night but has remained since admission. Reports tolerating breakfast well but lack of appetite remains. Courtney Holt shares her mother has always been a "picky" eater and plans to order baked fish for lunch as she typically enjoys this.  Reviewed conversations with Courtney Holt from previous PMT provider and boundaries that have been set on care-no dialysis and update to code status. Courtney Holt continues to remain hopeful for  recovery and wishes for time for outcomes.  Noted slight improvement in creatinine (6.33)  Answered and addressed all questions and concerns. Will need ongoing conversations to address GOC moving forward. Concern for ongoing encephalopathy and AKI superimposed on CKD IV, will need to address disposition in alignment with goals of care.  Encouraged to reach out to PMT with any questions or concerns. Plan to follow-up tomorrow.  Objective:  Physical Exam Constitutional:      General: She is not in acute distress.    Appearance: She is ill-appearing.  Pulmonary:     Effort: Pulmonary effort is normal. No respiratory distress.  Skin:    General: Skin is warm and dry.  Neurological:     Mental Status: She is alert. She  is disoriented.     Motor: Weakness present.             Vital Signs: BP (!) 152/96 (BP Location: Left Arm)   Pulse 87   Temp 98 F (36.7 C)   Resp 16   Ht 5' (1.524 m)   Wt 41.6 kg   SpO2 100%   BMI 17.91 kg/m  SpO2: SpO2: 100 % O2 Device: O2 Device: Room Air O2 Flow Rate:     Palliative Care Assessment & Plan   Assessment/Recommendation/Plan  DNR/DNI Time for outcomes with ongoing GOC needed as appropriate PMT to continue to follow for ongoing needs and support  Thank you for allowing the Palliative Medicine Team to assist in the care of this patient.  Total time:  25 minutes  Time spent includes: Detailed review of medical records (labs, imaging, vital signs), medically appropriate exam (mental status, respiratory, cardiac, skin), discussed with treatment team, counseling and educating patient, family and staff, documenting clinical information, medication management and coordination of care.  [name], DNP, AGNP-C Palliative Medicine   Please contact Palliative Medicine Team phone at [phone] for questions and concerns.

## 2022-06-22 NOTE — Care Management Important Message (Signed)
Important Message  Patient Details  Name: Courtney Holt MRN: [id number] Date of Birth: 1934-03-07   Medicare Important Message Given:  N/A - LOS <3 / Initial given by admissions     Olegario Messier A Wendal Wilkie 06/22/2022, 11:55 AM

## 2022-06-23 DIAGNOSIS — G9340 Encephalopathy, unspecified: Secondary | ICD-10-CM | POA: Diagnosis not present

## 2022-06-23 LAB — CBC
HCT: 25.1 % — ABNORMAL LOW (ref 36.0–46.0)
Hemoglobin: 8.2 g/dL — ABNORMAL LOW (ref 12.0–15.0)
MCH: 29.6 pg (ref 26.0–34.0)
MCHC: 32.7 g/dL (ref 30.0–36.0)
MCV: 90.6 fL (ref 80.0–100.0)
Platelets: 203 10*3/uL (ref 150–400)
RBC: 2.77 MIL/uL — ABNORMAL LOW (ref 3.87–5.11)
RDW: 15.9 % — ABNORMAL HIGH (ref 11.5–15.5)
WBC: 8.6 10*3/uL (ref 4.0–10.5)
nRBC: 0 % (ref 0.0–0.2)

## 2022-06-23 LAB — GLUCOSE, CAPILLARY
Glucose-Capillary: 86 mg/dL (ref 70–99)
Glucose-Capillary: 106 mg/dL — ABNORMAL HIGH (ref 70–99)
Glucose-Capillary: 280 mg/dL — ABNORMAL HIGH (ref 70–99)
Glucose-Capillary: 51 mg/dL — ABNORMAL LOW (ref 70–99)
Glucose-Capillary: 55 mg/dL — ABNORMAL LOW (ref 70–99)
Glucose-Capillary: 87 mg/dL (ref 70–99)

## 2022-06-23 LAB — BASIC METABOLIC PANEL
Anion gap: 8 (ref 5–15)
BUN: 56 mg/dL — ABNORMAL HIGH (ref 8–23)
CO2: 15 mmol/L — ABNORMAL LOW (ref 22–32)
Calcium: 7.6 mg/dL — ABNORMAL LOW (ref 8.9–10.3)
Chloride: 119 mmol/L — ABNORMAL HIGH (ref 98–111)
Creatinine, Ser: 6.12 mg/dL — ABNORMAL HIGH (ref 0.44–1.00)
GFR, Estimated: 6 mL/min — ABNORMAL LOW (ref >60)
Glucose, Bld: 98 mg/dL (ref 70–99)
Potassium: 4.3 mmol/L (ref 3.5–5.1)
Sodium: 142 mmol/L (ref 135–145)

## 2022-06-23 LAB — MAGNESIUM: Magnesium: 2.5 mg/dL — ABNORMAL HIGH (ref 1.7–2.4)

## 2022-06-23 LAB — PHOSPHORUS: Phosphorus: 5.3 mg/dL — ABNORMAL HIGH (ref 2.5–4.6)

## 2022-06-23 MED ORDER — MEGESTROL ACETATE 20 MG PO TABS
40.0000 mg | ORAL_TABLET | Freq: Every day | ORAL | Status: DC
  Administered 2022-06-23 – 2022-06-25 (×3): 40 mg via ORAL
  Filled 2022-06-23 (×3): qty 2

## 2022-06-23 NOTE — Progress Notes (Signed)
[facility]  ROUNDING NOTE   Subjective:   Courtney Holt is a 88 y.o. female with past medical history including hypertension, type 2 diabetes, iron deficiency anemia and chronic kidney disease stage IV.  Patient presents to the emergency department with family complaining of weakness and has been admitted for Acute encephalopathy [G93.40] AKI (acute kidney injury) (HCC) [N17.9] Anemia, unspecified type [D64.9]  Patient is known to our practice from a previous admission and was lost to follow-up after discharge.    Patient seen resting in bed, no family at bedside Easily aroused Alert and oriented to self  Creatinine 6.12  Objective:  Vital signs in last 24 hours:  Temp:  [97.7 F (36.5 C)-98.4 F (36.9 C)] 98.2 F (36.8 C) (05/25 0736) Pulse Rate:  [74-77] 76 (05/25 0736) Resp:  [16] 16 (05/25 0736) BP: (133-157)/(66-87) 139/87 (05/25 0736) SpO2:  [98 %-100 %] 99 % (05/25 0736) Weight:  [43.2 kg] 43.2 kg (05/25 0450)  Weight change:  Filed Weights   06/20/22 2155 06/21/22 0500 06/23/22 0450  Weight: 42.9 kg 41.6 kg 43.2 kg    Intake/Output: I/O last 3 completed shifts: In: 360 [P.O.:360] Out: 151 [Urine:150; Stool:1]   Intake/Output this shift:  No intake/output data recorded.  Physical Exam: General: NAD  Head: Normocephalic, atraumatic.  Dry oral mucosal membranes  Eyes: Anicteric  Lungs:  Clear to auscultation, normal effort, room air  Heart: Regular rate and rhythm  Abdomen:  Soft, nontender  Extremities: No peripheral edema.  Neurologic: Alert and oriented to self, moving all four extremities  Skin: No lesions  Access: None    Basic Metabolic Panel: Recent Labs  Lab 06/20/22 1259 06/21/22 0604 06/21/22 0927 06/22/22 0515 06/23/22 0441  NA 141 145  --  143 142  K 4.1 4.6  --  3.9 4.3  CL 117* 120*  --  118* 119*  CO2 13* 14*  --  17* 15*  GLUCOSE 96 92  --  95 98  BUN 52* 56*  --  57* 56*  CREATININE 6.54* 6.51*  --  6.33*  6.12*  CALCIUM 7.9* 7.8*  --  7.6* 7.6*  MG  --   --  2.5* 2.5* 2.5*  PHOS  --   --  5.8* 5.8* 5.3*     Liver Function Tests: No results for input(s): "AST", "ALT", "ALKPHOS", "BILITOT", "PROT", "ALBUMIN" in the last 168 hours. No results for input(s): "LIPASE", "AMYLASE" in the last 168 hours. No results for input(s): "AMMONIA" in the last 168 hours.  CBC: Recent Labs  Lab 06/20/22 1259 06/21/22 0604 06/22/22 0515 06/23/22 0441  WBC 9.3 8.7 7.8 8.6  NEUTROABS  --  6.3  --   --   HGB 6.8* 8.7* 7.6* 8.2*  HCT 21.1* 26.3* 22.6* 25.1*  MCV 93.8 90.1 87.9 90.6  PLT 224 214 187 203     Cardiac Enzymes: No results for input(s): "CKTOTAL", "CKMB", "CKMBINDEX", "TROPONINI" in the last 168 hours.  BNP: Invalid input(s): "POCBNP"  CBG: Recent Labs  Lab 06/22/22 1128 06/22/22 1616 06/22/22 2027 06/23/22 0745 06/23/22 1139  GLUCAP 108* 94 216* 86 106*     Microbiology: Results for orders placed or performed during the hospital encounter of 10/24/21  Culture, blood (routine x 2)     Status: None   Collection Time: 10/24/21  5:00 PM   Specimen: BLOOD  Result Value Ref Range Status   Specimen Description BLOOD LEFT ANTECUBITAL  Final   Special Requests   Final  BOTTLES DRAWN AEROBIC AND ANAEROBIC Blood Culture results may not be optimal due to an excessive volume of blood received in culture bottles   Culture   Final    NO GROWTH 5 DAYS Performed at [facility], [address] [place], [place] [address]    Report Status 10/29/2021 FINAL  Final  Resp Panel by RT-PCR (Flu A&B, Covid) Anterior Nasal Swab     Status: None   Collection Time: 10/24/21  5:00 PM   Specimen: Anterior Nasal Swab  Result Value Ref Range Status   SARS Coronavirus 2 by RT PCR NEGATIVE NEGATIVE Final    Comment: (NOTE) SARS-CoV-2 target nucleic acids are NOT DETECTED.  The SARS-CoV-2 RNA is generally detectable in upper respiratory specimens during the acute phase of infection.  The lowest concentration of SARS-CoV-2 viral copies this assay can detect is 138 copies/mL. A negative result does not preclude SARS-Cov-2 infection and should not be used as the sole basis for treatment or other patient management decisions. A negative result may occur with  improper specimen collection/handling, submission of specimen other than nasopharyngeal swab, presence of viral mutation(s) within the areas targeted by this assay, and inadequate number of viral copies(<138 copies/mL). A negative result must be combined with clinical observations, patient history, and epidemiological information. The expected result is Negative.  Fact Sheet for Patients:  BloggerCourse.com  Fact Sheet for Healthcare Providers:  SeriousBroker.it  This test is no t yet approved or cleared by the [place] FDA and  has been authorized for detection and/or diagnosis of SARS-CoV-2 by FDA under an Emergency Use Authorization (EUA). This EUA will remain  in effect (meaning this test can be used) for the duration of the COVID-19 declaration under Section 564(b)(1) of the Act, 21 U.S.C.section 360bbb-3(b)(1), unless the authorization is terminated  or revoked sooner.       Influenza A by PCR NEGATIVE NEGATIVE Final   Influenza B by PCR NEGATIVE NEGATIVE Final    Comment: (NOTE) The Xpert Xpress SARS-CoV-2/FLU/RSV plus assay is intended as an aid in the diagnosis of influenza from Nasopharyngeal swab specimens and should not be used as a sole basis for treatment. Nasal washings and aspirates are unacceptable for Xpert Xpress SARS-CoV-2/FLU/RSV testing.  Fact Sheet for Patients: BloggerCourse.com  Fact Sheet for Healthcare Providers: SeriousBroker.it  This test is not yet approved or cleared by the [place] FDA and has been authorized for detection and/or diagnosis of SARS-CoV-2 by FDA  under an Emergency Use Authorization (EUA). This EUA will remain in effect (meaning this test can be used) for the duration of the COVID-19 declaration under Section 564(b)(1) of the Act, 21 U.S.C. section 360bbb-3(b)(1), unless the authorization is terminated or revoked.  Performed at [facility], [address] [place], [place] [address]   Culture, blood (routine x 2)     Status: None   Collection Time: 10/24/21  6:00 PM   Specimen: BLOOD  Result Value Ref Range Status   Specimen Description BLOOD LEFT ANTECUBITAL  Final   Special Requests   Final    BOTTLES DRAWN AEROBIC AND ANAEROBIC Blood Culture results may not be optimal due to an excessive volume of blood received in culture bottles   Culture   Final    NO GROWTH 5 DAYS Performed at [facility], [address] [place], [place] [address]    Report Status 10/29/2021 FINAL  Final    Coagulation Studies: No results for input(s): "LABPROT", "INR" in the last 72  hours.  Urinalysis: No results for input(s): "COLORURINE", "LABSPEC", "PHURINE", "GLUCOSEU", "HGBUR", "BILIRUBINUR", "KETONESUR", "PROTEINUR", "UROBILINOGEN", "NITRITE", "LEUKOCYTESUR" in the last 72 hours.  Invalid input(s): "APPERANCEUR"    Imaging: No results found.   Medications:    sodium chloride Stopped (06/20/22 1734)   sodium bicarbonate 75 mEq in sodium chloride 0.45 % 1,075 mL infusion 100 mL/hr at 06/21/22 1018    amLODipine  10 mg Oral Daily   carvedilol  12.5 mg Oral BID WC   feeding supplement (GLUCERNA SHAKE)  237 mL Oral TID BM   insulin aspart  0-6 Units Subcutaneous TID WC   ketotifen  1 drop Both Eyes BID   megestrol  40 mg Oral Daily   nicotine  21 mg Transdermal Daily   pantoprazole  40 mg Oral BID   polyvinyl alcohol  1 drop Both Eyes BID   sodium chloride flush  3 mL Intravenous Q12H   acetaminophen **OR** acetaminophen, hydrALAZINE, ondansetron **OR** ondansetron (ZOFRAN) IV  Assessment/ Plan:  Ms.  Courtney Holt is a 88 y.o.  female  with past medical history including hypertension, type 2 diabetes, iron deficiency anemia and chronic kidney disease stage IV.  Patient presents to the emergency department with family complaining of weakness and has been admitted for Acute encephalopathy [G93.40] AKI (acute kidney injury) (HCC) [N17.9] Anemia, unspecified type [D64.9]   Acute Kidney Injury on chronic kidney disease stage 4 with baseline creatinine 3.16 and GFR of 14 on 10/27/2021.  Acute kidney injury secondary to suspected hypovolemia Chronic kidney disease is secondary to advanced age, hypertension, diabetes.  Renal ultrasound negative for hydronephrosis, does show echogenic kidneys.    Creatinine improving with IV fluids.  Continue to avoid nephrotoxic agents and therapies.  Appreciate palliative care consult and establishment of goals of care which do not include dialysis.   Lab Results  Component Value Date   CREATININE 6.12 (H) 06/23/2022   CREATININE 6.33 (H) 06/22/2022   CREATININE 6.51 (H) 06/21/2022    Intake/Output Summary (Last 24 hours) at 06/23/2022 1409 Last data filed at 06/22/2022 1905 Gross per 24 hour  Intake 120 ml  Output --  Net 120 ml     2.  Acute metabolic acidosis.  Serum bicarb 15.  Continue sodium bicarb infusion.  3. . Anemia of chronic kidney disease Lab Results  Component Value Date   HGB 8.2 (L) 06/23/2022   Patient has received blood transfusion during this admission.  Hemoglobin continues to improve after receiving blood transfusion.  4.  Hypertension with chronic kidney disease.  Home regimen includes amlodipine and carvedilol.  Currently receiving these medications. Blood pressure acceptable for this patient.   5. Secondary Hyperparathyroidism: with outpatient labs: None Lab Results  Component Value Date   CALCIUM 7.6 (L) 06/23/2022   PHOS 5.3 (H) 06/23/2022    Patient prescribed cholecalciferol outpatient.  Calcium stable. Phosphorus  within desired range.    LOS: 3   5/25/20242:09 PM

## 2022-06-23 NOTE — Progress Notes (Signed)
Visited with Courtney Holt at her bedside, resting comfortably. No family at bedside during time of visit.  Attempted to call daughter-[name], VM left, awaiting return call.  No Charge.  [name], DNP, AGNP-C Palliative Medicine  Please call Palliative Medicine team phone with any questions [phone]. For individual providers please see AMION.

## 2022-06-23 NOTE — Progress Notes (Signed)
[facility] Progress Note  Patient: Courtney Holt    ZOX:[id number]  DOA: 06/20/2022     Date of Service: the patient was seen and examined on 06/23/2022  Chief Complaint  Patient presents with   Weakness   Brief hospital course: Courtney Holt is a 88 y.o. female with medical history significant of hypertension, HFpEF, CKD stage IV, CVA, hyperlipidemia, type 2 diabetes, who presents to the ED due to altered mental status.  History obtained through chart review given patient's altered mental status. Ed w/up: Hypertensive emergency, BP 208/113, HR 99.  CBC Hb 6.9, bicarb 13 metabolic acidosis, creatinine 8.11 AKI on CKD stage IV. CT head was obtained with no acute or cranial abnormality. FOBT was positive. 1 unit of packed RBCs was transfused. S/p IV labetalol given in the ED.  TRH consulted for admission and further management as below.   Assessment and Plan:  Metabolic encephalopathy could be secondary to renal failure uremic encephalopathy. CT head and MRI brain did not show any acute findings. Continue supportive care and fall precautions.   AKI on CKD stage IV with metabolic acidosis Creatinine 6.54 and  CO2 13 on admission Started bicarb IV infusion Monitor renal functions and urine output Avoid nephrotoxic medications Use renally dose medications US renal: No hydronephrosis. Echogenic kidneys with cortical thinning compatible with medical renal disease. Nephrology consulted Cr 6.12 slightly improved Co2 15, got worse, as patient was not receiving bicarb infusion for unknown reason.  Discussed with RN to restart bicarb infusion.   Hypertensive emergency Patient presented with a BP 208/113 and AKI, endorgan damage with encephalopathy S/p labetalol IV given in the ED Continue amlodipine 10 mg p.o. daily, Coreg 12.5 mg p.o. twice daily Use IV hydralazine as needed BNP 1834 elevated most likely due to hypertensive emergency, patient is euvolemic. Monitor volume  status Monitor BP and titrate medications accordingly  Anemia multifactorial due to GI bleed and CKD stage IV FOBT positive, no obvious bleeding Hb 6.8 on admission, patient received 1 unit of PRBC transfusion.  Posttransfusion Hb 8.7 Discussed with the patient's daughter at bedside who does not want EGD or colonoscopy, would prefer conservative management Monitor H&H and transfuse if Hb less than 7 Iron profile within normal range, folate 8.9 wnl and B12 level 1227 over the NL Hb 8.2  Conjunctival injection, could be due to dry eyes versus allergies Less likely conjunctivitis Started ketotifen and artificial tears twice daily for 5 days  Poor appetite, started Glucerna protein supplement.  Started Megace. Body mass index is 17.91 kg/m.  Interventions:    Diet: Heart healthy diet DVT Prophylaxis: SCD, pharmacological prophylaxis contraindicated due to GI bleeding    Advance goals of care discussion: Full code  Family Communication: family was present at bedside, at the time of interview.  The pt provided permission to discuss medical plan with the family. Opportunity was given to ask question and all questions were answered satisfactorily.   Disposition:  Pt is from [facility], admitted with AMS, Renal Failure and low Hb FOBT +, still has Renal failure on Bicarb gtt, which precludes a safe discharge. Discharge to SNF TBD after PT/OT eval, when clinically stable.  Subjective: No significant events overnight, patient was sleepy in the morning, resting comfortably.  Without any acute distress.  Patient's daughter arrived at bedside, management plan discussed. It seems patient may not slept properly last night, so I did not wake her up. RN was advised to keep monitoring closely.   Physical Exam: General: NAD, lying comfortably  Appear in no distress, sleeping comfortably Eyes: closed, sleepy ENT: Patient was sleepy Neck: no JVD,  Cardiovascular: S1 and S2 Present, no Murmur,   Respiratory: good respiratory effort, Bilateral Air entry equal and Decreased, no Crackles, no wheezes Abdomen: Bowel Sound present, Soft and no tenderness,  Skin: no rashes Extremities: no Pedal edema, no calf tenderness Neurologic: without any new focal findings Gait not checked due to patient safety concerns  Vitals:   06/22/22 1955 06/23/22 0423 06/23/22 0450 06/23/22 0736  BP: 133/66 (!) 157/78  139/87  Pulse: 74 77  76  Resp: 16 16  16   Temp: 98.4 F (36.9 C) 97.7 F (36.5 C)  98.2 F (36.8 C)  TempSrc:    Oral  SpO2: 98% 100%  99%  Weight:   43.2 kg   Height:        Intake/Output Summary (Last 24 hours) at 06/23/2022 1324 Last data filed at 06/22/2022 1905 Gross per 24 hour  Intake 120 ml  Output --  Net 120 ml   Filed Weights   06/20/22 2155 06/21/22 0500 06/23/22 0450  Weight: 42.9 kg 41.6 kg 43.2 kg    Data Reviewed: I have personally reviewed and interpreted daily labs, tele strips, imagings as discussed above. I reviewed all nursing notes, pharmacy notes, vitals, pertinent old records I have discussed plan of care as described above with RN and patient/family.  CBC: Recent Labs  Lab 06/20/22 1259 06/21/22 0604 06/22/22 0515 06/23/22 0441  WBC 9.3 8.7 7.8 8.6  NEUTROABS  --  6.3  --   --   HGB 6.8* 8.7* 7.6* 8.2*  HCT 21.1* 26.3* 22.6* 25.1*  MCV 93.8 90.1 87.9 90.6  PLT 224 214 187 203   Basic Metabolic Panel: Recent Labs  Lab 06/20/22 1259 06/21/22 0604 06/21/22 0927 06/22/22 0515 06/23/22 0441  NA 141 145  --  143 142  K 4.1 4.6  --  3.9 4.3  CL 117* 120*  --  118* 119*  CO2 13* 14*  --  17* 15*  GLUCOSE 96 92  --  95 98  BUN 52* 56*  --  57* 56*  CREATININE 6.54* 6.51*  --  6.33* 6.12*  CALCIUM 7.9* 7.8*  --  7.6* 7.6*  MG  --   --  2.5* 2.5* 2.5*  PHOS  --   --  5.8* 5.8* 5.3*    Studies: No results found.  Scheduled Meds:  amLODipine  10 mg Oral Daily   carvedilol  12.5 mg Oral BID WC   feeding supplement (GLUCERNA SHAKE)   237 mL Oral TID BM   insulin aspart  0-6 Units Subcutaneous TID WC   ketotifen  1 drop Both Eyes BID   megestrol  40 mg Oral Daily   nicotine  21 mg Transdermal Daily   pantoprazole  40 mg Oral BID   polyvinyl alcohol  1 drop Both Eyes BID   sodium chloride flush  3 mL Intravenous Q12H   Continuous Infusions:  sodium chloride Stopped (06/20/22 1734)   sodium bicarbonate 75 mEq in sodium chloride 0.45 % 1,075 mL infusion 100 mL/hr at 06/21/22 1018   PRN Meds: acetaminophen **OR** acetaminophen, hydrALAZINE, ondansetron **OR** ondansetron (ZOFRAN) IV  Time spent: 35 minutes  Author: Gillis Santa. MD [facility] 06/23/2022 1:24 PM  To reach On-call, see care teams to locate the attending and reach out to them via [email or web address]. If 7PM-7AM, please contact night-coverage If you still have difficulty reaching the attending provider, please  page the [facility] (Director on Call) for [facility] on amion for assistance.

## 2022-06-24 DIAGNOSIS — G9340 Encephalopathy, unspecified: Secondary | ICD-10-CM | POA: Diagnosis not present

## 2022-06-24 LAB — BASIC METABOLIC PANEL
Anion gap: 7 (ref 5–15)
BUN: 59 mg/dL — ABNORMAL HIGH (ref 8–23)
CO2: 18 mmol/L — ABNORMAL LOW (ref 22–32)
Calcium: 7.5 mg/dL — ABNORMAL LOW (ref 8.9–10.3)
Chloride: 118 mmol/L — ABNORMAL HIGH (ref 98–111)
Creatinine, Ser: 6.18 mg/dL — ABNORMAL HIGH (ref 0.44–1.00)
GFR, Estimated: 6 mL/min — ABNORMAL LOW (ref >60)
Glucose, Bld: 165 mg/dL — ABNORMAL HIGH (ref 70–99)
Potassium: 4.4 mmol/L (ref 3.5–5.1)
Sodium: 143 mmol/L (ref 135–145)

## 2022-06-24 LAB — CBC
HCT: 24 % — ABNORMAL LOW (ref 36.0–46.0)
Hemoglobin: 7.9 g/dL — ABNORMAL LOW (ref 12.0–15.0)
MCH: 29.7 pg (ref 26.0–34.0)
MCHC: 32.9 g/dL (ref 30.0–36.0)
MCV: 90.2 fL (ref 80.0–100.0)
Platelets: 180 10*3/uL (ref 150–400)
RBC: 2.66 MIL/uL — ABNORMAL LOW (ref 3.87–5.11)
RDW: 15.9 % — ABNORMAL HIGH (ref 11.5–15.5)
WBC: 7.2 10*3/uL (ref 4.0–10.5)
nRBC: 0 % (ref 0.0–0.2)

## 2022-06-24 LAB — MAGNESIUM: Magnesium: 2.4 mg/dL (ref 1.7–2.4)

## 2022-06-24 LAB — GLUCOSE, CAPILLARY
Glucose-Capillary: 124 mg/dL — ABNORMAL HIGH (ref 70–99)
Glucose-Capillary: 174 mg/dL — ABNORMAL HIGH (ref 70–99)
Glucose-Capillary: 154 mg/dL — ABNORMAL HIGH (ref 70–99)
Glucose-Capillary: 121 mg/dL — ABNORMAL HIGH (ref 70–99)

## 2022-06-24 LAB — PHOSPHORUS: Phosphorus: 4.6 mg/dL (ref 2.5–4.6)

## 2022-06-24 NOTE — Progress Notes (Signed)
[facility] Progress Note  Patient: Courtney Holt    NUU:[id number]  DOA: 06/20/2022     Date of Service: the patient was seen and examined on 06/24/2022  Chief Complaint  Patient presents with   Weakness   Brief hospital course: Madlen Ramdial is a 88 y.o. female with medical history significant of hypertension, HFpEF, CKD stage IV, CVA, hyperlipidemia, type 2 diabetes, who presents to the ED due to altered mental status.  History obtained through chart review given patient's altered mental status. Ed w/up: Hypertensive emergency, BP 208/113, HR 99.  CBC Hb 6.9, bicarb 13 metabolic acidosis, creatinine 3.47 AKI on CKD stage IV. CT head was obtained with no acute or cranial abnormality. FOBT was positive. 1 unit of packed RBCs was transfused. S/p IV labetalol given in the ED.  TRH consulted for admission and further management as below.   Assessment and Plan:  # Metabolic encephalopathy could be secondary to renal failure uremic encephalopathy. CT head and MRI brain did not show any acute findings. Continue supportive care and fall precautions.   # AKI on CKD stage IV with metabolic acidosis Creatinine 6.54 and  CO2 13 on admission Started bicarb IV infusion Monitor renal functions and urine output Avoid nephrotoxic medications Use renally dose medications US renal: No hydronephrosis. Echogenic kidneys with cortical thinning compatible with medical renal disease. Nephrology consulted Cr 6.18 no improvement Co2 15, got worse, as patient was not receiving bicarb infusion for unknown reason.  Discussed with RN to restart bicarb infusion. 5/26 bicarb 18, improving  # Hypertensive emergency Patient presented with a BP 208/113 and AKI, endorgan damage with encephalopathy S/p labetalol IV given in the ED Continue amlodipine 10 mg p.o. daily, Coreg 12.5 mg p.o. twice daily Use IV hydralazine as needed BNP 1834 elevated most likely due to hypertensive emergency, patient is  euvolemic. Monitor volume status Monitor BP and titrate medications accordingly  # Anemia multifactorial due to GI bleed and CKD stage IV FOBT positive, no obvious bleeding Hb 6.8 on admission, patient received 1 unit of PRBC transfusion.  Posttransfusion Hb 8.7 Discussed with the patient's daughter at bedside who does not want EGD or colonoscopy, would prefer conservative management Monitor H&H and transfuse if Hb less than 7 Iron profile within normal range, folate 8.9 wnl and B12 level 1227 over the NL Hb 7.9  # Conjunctival injection, could be due to dry eyes versus allergies Less likely conjunctivitis Started ketotifen and artificial tears twice daily for 5 days  Poor appetite, started Glucerna protein supplement.  Started Megace. Body mass index is 17.91 kg/m.  Interventions:    Diet: Dysphagia 3 diet with thin liquids DVT Prophylaxis: SCD, pharmacological prophylaxis contraindicated due to GI bleeding    Advance goals of care discussion: Full code  Family Communication: family was present at bedside, at the time of interview.  The pt provided permission to discuss medical plan with the family. Opportunity was given to ask question and all questions were answered satisfactorily.   Disposition:  Pt is from [facility], admitted with AMS, Renal Failure and low Hb FOBT +, still has Renal failure on Bicarb gtt, which precludes a safe discharge. Discharge to SNF as per PT/OT eval, when clinically stable.  Subjective: No significant events overnight, patient is awake and alert today, AO x 1.  Denies any complaint, she was eating breakfast without any problems.  Physical Exam: General: NAD, lying comfortably Appear in no distress, sleeping comfortably Eyes: closed, sleepy ENT: Patient was sleepy Neck: no  JVD,  Cardiovascular: S1 and S2 Present, no Murmur,  Respiratory: good respiratory effort, Bilateral Air entry equal and Decreased, no Crackles, no wheezes Abdomen: Bowel Sound  present, Soft and no tenderness,  Skin: no rashes Extremities: no Pedal edema, no calf tenderness Neurologic: without any new focal findings Gait not checked due to patient safety concerns  Vitals:   06/23/22 2104 06/24/22 0500 06/24/22 0611 06/24/22 0728  BP: (!) 120/56  (!) 150/69 (!) 152/75  Pulse: 67  78 74  Resp: 18  18 16   Temp: 98.6 F (37 C)  98.7 F (37.1 C) 98 F (36.7 C)  TempSrc:      SpO2: 96%  100% 99%  Weight:  43.8 kg    Height:        Intake/Output Summary (Last 24 hours) at 06/24/2022 1338 Last data filed at 06/24/2022 1007 Gross per 24 hour  Intake 480 ml  Output 300 ml  Net 180 ml   Filed Weights   06/21/22 0500 06/23/22 0450 06/24/22 0500  Weight: 41.6 kg 43.2 kg 43.8 kg    Data Reviewed: I have personally reviewed and interpreted daily labs, tele strips, imagings as discussed above. I reviewed all nursing notes, pharmacy notes, vitals, pertinent old records I have discussed plan of care as described above with RN and patient/family.  CBC: Recent Labs  Lab 06/20/22 1259 06/21/22 0604 06/22/22 0515 06/23/22 0441 06/24/22 0432  WBC 9.3 8.7 7.8 8.6 7.2  NEUTROABS  --  6.3  --   --   --   HGB 6.8* 8.7* 7.6* 8.2* 7.9*  HCT 21.1* 26.3* 22.6* 25.1* 24.0*  MCV 93.8 90.1 87.9 90.6 90.2  PLT 224 214 187 203 180   Basic Metabolic Panel: Recent Labs  Lab 06/20/22 1259 06/21/22 0604 06/21/22 0927 06/22/22 0515 06/23/22 0441 06/24/22 0432  NA 141 145  --  143 142 143  K 4.1 4.6  --  3.9 4.3 4.4  CL 117* 120*  --  118* 119* 118*  CO2 13* 14*  --  17* 15* 18*  GLUCOSE 96 92  --  95 98 165*  BUN 52* 56*  --  57* 56* 59*  CREATININE 6.54* 6.51*  --  6.33* 6.12* 6.18*  CALCIUM 7.9* 7.8*  --  7.6* 7.6* 7.5*  MG  --   --  2.5* 2.5* 2.5* 2.4  PHOS  --   --  5.8* 5.8* 5.3* 4.6    Studies: No results found.  Scheduled Meds:  amLODipine  10 mg Oral Daily   carvedilol  12.5 mg Oral BID WC   feeding supplement (GLUCERNA SHAKE)  237 mL Oral TID BM    insulin aspart  0-6 Units Subcutaneous TID WC   ketotifen  1 drop Both Eyes BID   megestrol  40 mg Oral Daily   nicotine  21 mg Transdermal Daily   pantoprazole  40 mg Oral BID   polyvinyl alcohol  1 drop Both Eyes BID   sodium chloride flush  3 mL Intravenous Q12H   Continuous Infusions:  sodium chloride Stopped (06/20/22 1734)   sodium bicarbonate 75 mEq in sodium chloride 0.45 % 1,075 mL infusion 100 mL/hr at 06/23/22 1539   PRN Meds: acetaminophen **OR** acetaminophen, hydrALAZINE, ondansetron **OR** ondansetron (ZOFRAN) IV  Time spent: 35 minutes  Author: Gillis Santa. MD [facility] 06/24/2022 1:38 PM  To reach On-call, see care teams to locate the attending and reach out to them via [email or web address]. If 7PM-7AM, please contact night-coverage  If you still have difficulty reaching the attending provider, please page the [facility] (Director on Call) for [facility] on amion for assistance.

## 2022-06-24 NOTE — Progress Notes (Signed)
Physical Therapy Treatment Patient Details Name: Courtney Holt MRN: [id number] DOB: 25-Aug-1934 Today's Date: 06/24/2022   History of Present Illness 88 y/o female presented to ED on 06/20/22 for increasing confusion. Admitted for acute encephalopathy. MRI negative. PMH: CKD, HFpEF, CVA, T2DM, HTN    PT Comments    Pt in bed.  Awakens to voice.  She repeats she is cold when asked to get OOB.  She does not initiate movement and mod/max a x 1 is given to get her to EOB.  She does not resist task but does not assist either.  Steady in sitting.  She requires min a x 1 to stand and when given walker does not put her hands on it despite cues so it is removed and HHA is given.  She is able to walk 100 with min a x 1.  Does c/o some soreness BLE's with gait.  Remains up in chair after session with needs met.  Of note there is some bloody discharge on bed pad.  RN aware.    She does need tactile cues and assist to initiate movement.  Confusion remains which is primary barrier at this time.     Recommendations for follow up therapy are one component of a multi-disciplinary discharge planning process, led by the attending physician.  Recommendations may be updated based on patient status, additional functional criteria and insurance authorization.  Follow Up Recommendations       Assistance Recommended at Discharge    Patient can return home with the following A little help with walking and/or transfers;A lot of help with bathing/dressing/bathroom;Assistance with cooking/housework;Direct supervision/assist for medications management;Direct supervision/assist for financial management;Assist for transportation;Help with stairs or ramp for entrance   Equipment Recommendations       Recommendations for Other Services       Precautions / Restrictions Precautions Precautions: Fall Restrictions Weight Bearing Restrictions: No     Mobility  Bed Mobility Overal bed mobility: Needs Assistance Bed  Mobility: Supine to Sit     Supine to sit: Mod assist, Max assist     General bed mobility comments: assist given as she was not initiating task.  did not resist movement Patient Response: Cooperative  Transfers Overall transfer level: Needs assistance Equipment used: 1 person hand held assist Transfers: Sit to/from Stand Sit to Stand: Min assist                Ambulation/Gait Ambulation/Gait assistance: Min guard, Min assist Gait Distance (Feet): 100 Feet Assistive device: 1 person hand held assist Gait Pattern/deviations: Step-through pattern, Decreased step length - right, Decreased step length - left, Trunk flexed Gait velocity: decreased     General Gait Details: does well with HHA.  did not seem to know how to like to use RW   Stairs             Wheelchair Mobility    Modified Rankin (Stroke Patients Only)       Balance Overall balance assessment: Needs assistance Sitting-balance support: Feet supported Sitting balance-Leahy Scale: Fair     Standing balance support: Single extremity supported Standing balance-Leahy Scale: Poor Standing balance comment: +1 assist at all tmes                            Cognition Arousal/Alertness: Awake/alert Behavior During Therapy: WFL for tasks assessed/performed Overall Cognitive Status: Impaired/Different from baseline  General Comments: sleepy but awakens with cues and assist.  resistant to gait but does with encouragement        Exercises      General Comments        Pertinent Vitals/Pain Pain Assessment Pain Assessment: Faces Faces Pain Scale: Hurts a little bit Pain Location: legs Pain Descriptors / Indicators: Sore Pain Intervention(s): Limited activity within patient's tolerance, Monitored during session, Repositioned    Home Living                          Prior Function            PT Goals (current goals can  now be found in the care plan section) Progress towards PT goals: Progressing toward goals    Frequency    Min 2X/week      PT Plan Current plan remains appropriate    Co-evaluation              AM-PAC PT "6 Clicks" Mobility   Outcome Measure  Help needed turning from your back to your side while in a flat bed without using bedrails?: A Little Help needed moving from lying on your back to sitting on the side of a flat bed without using bedrails?: A Little Help needed moving to and from a bed to a chair (including a wheelchair)?: A Little Help needed standing up from a chair using your arms (e.g., wheelchair or bedside chair)?: A Little Help needed to walk in hospital room?: A Little Help needed climbing 3-5 steps with a railing? : A Lot 6 Click Score: 17    End of Session Equipment Utilized During Treatment: Gait belt Activity Tolerance: Patient tolerated treatment well Patient left: in chair;with call bell/phone within reach;with chair alarm set Nurse Communication: Mobility status PT Visit Diagnosis: Unsteadiness on feet (R26.81);Muscle weakness (generalized) (M62.81);Other abnormalities of gait and mobility (R26.89)     Time: 0981-1914 PT Time Calculation (min) (ACUTE ONLY): 17 min  Charges:  $Gait Training: 8-22 mins                   Danielle Dess, PTA 06/24/22, 12:44 PM

## 2022-06-25 DIAGNOSIS — G9340 Encephalopathy, unspecified: Secondary | ICD-10-CM | POA: Diagnosis not present

## 2022-06-25 LAB — BASIC METABOLIC PANEL
Anion gap: 4 — ABNORMAL LOW (ref 5–15)
BUN: 62 mg/dL — ABNORMAL HIGH (ref 8–23)
CO2: 21 mmol/L — ABNORMAL LOW (ref 22–32)
Calcium: 7.1 mg/dL — ABNORMAL LOW (ref 8.9–10.3)
Chloride: 114 mmol/L — ABNORMAL HIGH (ref 98–111)
Creatinine, Ser: 6.26 mg/dL — ABNORMAL HIGH (ref 0.44–1.00)
GFR, Estimated: 6 mL/min — ABNORMAL LOW (ref >60)
Glucose, Bld: 146 mg/dL — ABNORMAL HIGH (ref 70–99)
Potassium: 4.9 mmol/L (ref 3.5–5.1)
Sodium: 139 mmol/L (ref 135–145)

## 2022-06-25 LAB — CBC
HCT: 24 % — ABNORMAL LOW (ref 36.0–46.0)
Hemoglobin: 7.9 g/dL — ABNORMAL LOW (ref 12.0–15.0)
MCH: 29.4 pg (ref 26.0–34.0)
MCHC: 32.9 g/dL (ref 30.0–36.0)
MCV: 89.2 fL (ref 80.0–100.0)
Platelets: 199 10*3/uL (ref 150–400)
RBC: 2.69 MIL/uL — ABNORMAL LOW (ref 3.87–5.11)
RDW: 15.9 % — ABNORMAL HIGH (ref 11.5–15.5)
WBC: 7.9 10*3/uL (ref 4.0–10.5)
nRBC: 0 % (ref 0.0–0.2)

## 2022-06-25 LAB — GLUCOSE, CAPILLARY
Glucose-Capillary: 84 mg/dL (ref 70–99)
Glucose-Capillary: 98 mg/dL (ref 70–99)

## 2022-06-25 MED ORDER — PANTOPRAZOLE SODIUM 40 MG PO TBEC: 40.0000 mg | DELAYED_RELEASE_TABLET | Freq: Every day | ORAL | Status: DC

## 2022-06-25 MED ORDER — SODIUM BICARBONATE 650 MG PO TABS: 650.0000 mg | ORAL_TABLET | Freq: Two times a day (BID) | ORAL | 11 refills | Status: DC

## 2022-06-25 NOTE — Discharge Summary (Signed)
[facility] Discharge Summary   Patient: Courtney Holt ZOX:[id number]  PCP: [facility]  Date of admission: 06/20/2022   Date of discharge:  06/25/2022     Discharge Diagnoses:  Principal Problem:   Acute encephalopathy Active Problems:   Acute kidney injury superimposed on chronic kidney disease (HCC)   Acute on chronic anemia   GI bleed   Diabetes mellitus type 2, controlled, without complications (HCC)   Essential hypertension   (HFpEF) heart failure with preserved ejection fraction (HCC)   Admitted From: Home  Disposition:  SNF  Recommendations for Outpatient Follow-up:  Follow-up with PCP, patient should be seen by an MD in 1 to 2 days at the facility. Follow with palliative care, patient may need hospice care down the road. Discussed with patient's daughter who does not want to escalate her care, does not want hemodialysis, would like to keep her comfortable and continue current medications.  If her condition deteriorates then she will be comfort measures only for hospice, no need to bring her to the hospital.  Patient is DNR/DNI Follow up LABS/TEST:  None   Contact information for after-discharge care     Destination     [facility] .   Service: Skilled Nursing Contact information: [address] [place] [address] [phone]                    Diet recommendation: Dysphagia type 3 Thin Liquid  Activity: The patient is advised to gradually reintroduce usual activities, as tolerated  Discharge Condition: stable  Code Status: DNR   History of present illness: As per the H and P dictated on admission Hospital Course:  Courtney Holt is a 88 y.o. female with medical history significant of hypertension, HFpEF, CKD stage IV, CVA, hyperlipidemia, type 2 diabetes, who presents to the ED due to altered mental status.  History obtained through chart review given patient's altered mental  status. Ed w/up: Hypertensive emergency, BP 208/113, HR 99.  CBC Hb 6.9, bicarb 13 metabolic acidosis, creatinine 0.86 AKI on CKD stage IV. CT head was obtained with no acute or cranial abnormality. FOBT was positive. 1 unit of packed RBCs was transfused. S/p IV labetalol given in the ED.  TRH consulted for admission and further management as below.  Assessment and Plan:  # Metabolic encephalopathy could be secondary to renal failure uremic encephalopathy. CT head and MRI brain did not show any acute findings. Continue supportive care and fall precautions.   # AKI on CKD stage IV with metabolic acidosis, progressed to CKD stage V, end-stage renal disease. Creatinine 6.54 and  CO2 13 on admission. S/p IVF and bicarb IV infusion. Cr 6.26 slightly improved, bicarb 21, improved.  Patient was transition to oral bicarbonate 650 mg p.o. twice daily.  Discussed goals of care with patient's daughter who does not want her to go on hemodialysis in future.  Patient was cleared by nephrology to discharge on oral bicarbonate. US renal: No hydronephrosis. Echogenic kidneys with cortical thinning compatible with medical renal disease. Nephrology consulted # Hypertensive emergency: Patient presented with a BP 208/113 and AKI, endorgan damage with encephalopathy S/p labetalol IV given in the ED. Continue amlodipine 10 mg p.o. daily, Coreg 12.5 mg p.o. twice daily BNP 1834 elevated most likely due to hypertensive emergency, patient is euvolemic. Monitor BP and titrate medications accordingly   # Anemia multifactorial due to GI bleed and CKD stage IV FOBT positive, no obvious bleeding. Hb 6.8 on admission,  patient received 1 unit of PRBC transfusion.  Hb 7.9, stable now.  Patient was started on pantoprazole 40 mg p.o. twice daily, transition to pantoprazole 40 mg p.o. daily. Iron profile within normal range, folate 8.9 wnl and B12 level 1227 over the NL # Conjunctival injection, could be due to dry eyes versus  allergies, Less likely conjunctivitis Started ketotifen and artificial tears BID.  Improved redness.  No complaints.   Poor appetite, s/p Glucerna protein supplement and Megace given during hospital stay. Body mass index is 20.24 kg/m.    Patient was seen by physical therapy, who recommended Therapy, SNF placement, which was arranged. On the day of the discharge the patient's vitals were stable, and no other acute medical condition were reported by patient. the patient was felt safe to be discharge at [facility] .  Consultants: Nephrologist Procedures: None  Discharge Exam: General: Appear in no distress, no Rash; Oral Mucosa Clear, moist. Cardiovascular: S1 and S2 Present, no Murmur, Respiratory: normal respiratory effort, Bilateral Air entry present and no Crackles, no wheezes Abdomen: Bowel Sound present, Soft and no tenderness, no hernia Extremities: no Pedal edema, no calf tenderness Neurology:Severe dementia, no focal deficits, sometimes answers her name only. affect flat, demented  Filed Weights   06/23/22 0450 06/24/22 0500 06/25/22 0716  Weight: 43.2 kg 43.8 kg 47 kg   Vitals:   06/25/22 0531 06/25/22 0723  BP: (!) 167/81 139/65  Pulse: 80 81  Resp: 18 16  Temp: 98.6 F (37 C) 98.3 F (36.8 C)  SpO2: 97% 99%    DISCHARGE MEDICATION: Allergies as of 06/25/2022       Reactions   Penicillins         Medication List     TAKE these medications    amLODipine 10 MG tablet Commonly known as: NORVASC Take 10 mg by mouth daily.   carvedilol 12.5 MG tablet Commonly known as: COREG Take 12.5 mg by mouth 2 (two) times daily.   pantoprazole 40 MG tablet Commonly known as: PROTONIX Take 1 tablet (40 mg total) by mouth daily.   sodium bicarbonate 650 MG tablet Take 1 tablet (650 mg total) by mouth 2 (two) times daily.   Vitamin D (Cholecalciferol) 10 MCG (400 UNIT) Tabs Take 2 tablets by mouth daily.       Allergies  Allergen Reactions   Penicillins     Discharge Instructions     Diet - low sodium heart healthy   Complete by: As directed    Discharge instructions   Complete by: As directed    Follow-up with PCP, patient should be seen by an MD in 1 to 2 days at the facility. Follow with palliative care, patient may need hospice care down the road. Discussed with patient's daughter who does not want to escalate her care, does not want hemodialysis, would like to keep her comfortable and continue current medications.  If her condition deteriorates then she will be comfort measures only for hospice, no need to bring her to the hospital.  Patient is DNR/DNI   Increase activity slowly   Complete by: As directed        The results of significant diagnostics from this hospitalization (including imaging, microbiology, ancillary and laboratory) are listed below for reference.    Significant Diagnostic Studies: US RENAL  Result Date: 06/21/2022 CLINICAL DATA:  [id number] Acute kidney failure (HCC) [id number] EXAM: RENAL / URINARY TRACT ULTRASOUND COMPLETE COMPARISON:  Ultrasound 10/26/2021 FINDINGS: Right Kidney: Renal measurements: 8.4 x 4.2 x  3.9 cm = volume: There is 71.3. mL. Increased renal cortical echogenicity with renal cortical thinning. No hydronephrosis. Simple cysts measuring 1.2 and 0.9 cm which require no follow-up imaging. Left Kidney: Renal measurements: 8.9 x 4.8 x 3.3 cm = volume: There is 72.7 mL. Increased renal cortical echogenicity with renal cortical thinning. No hydronephrosis. There is a simple cyst measuring 2.4 and a minimally complex 1.2 cm cyst which require no follow-up imaging. Bladder: Appears normal for degree of bladder distention. Other: None. IMPRESSION: No hydronephrosis. Echogenic kidneys with cortical thinning compatible with medical renal disease. Electronically Signed   By: Caprice Renshaw M.D.   On: 06/21/2022 12:41   MR BRAIN WO CONTRAST  Result Date: 06/20/2022 CLINICAL DATA:  Altered mental status EXAM: MRI HEAD  WITHOUT CONTRAST TECHNIQUE: Multiplanar, multiecho pulse sequences of the brain and surrounding structures were obtained without intravenous contrast. COMPARISON:  None Available. FINDINGS: Brain: No acute infarct, mass effect or extra-axial collection. No acute or chronic hemorrhage. There is confluent hyperintense T2-weighted signal within the white matter. Generalized volume loss. Old infarcts of posterior left parietal lobe, right occipital lobe and the cerebellum. The midline structures are normal. Vascular: Major flow voids are preserved. Skull and upper cervical spine: Normal calvarium and skull base. Visualized upper cervical spine and soft tissues are normal. Sinuses/Orbits:Bilateral mastoid fluid.  Normal orbits. IMPRESSION: 1. No acute intracranial abnormality. 2. Old infarcts of posterior left parietal lobe, right occipital lobe and the cerebellum. Electronically Signed   By: Deatra Robinson M.D.   On: 06/20/2022 23:54   CT Head Wo Contrast  Result Date: 06/20/2022 CLINICAL DATA:  Mental status change, unknown cause EXAM: CT HEAD WITHOUT CONTRAST TECHNIQUE: Contiguous axial images were obtained from the base of the skull through the vertex without intravenous contrast. RADIATION DOSE REDUCTION: This exam was performed according to the departmental dose-optimization program which includes automated exposure control, adjustment of the mA and/or kV according to patient size and/or use of iterative reconstruction technique. COMPARISON:  CT Head 10/24/21 FINDINGS: Brain: There is sequela of severe chronic microvascular ischemic change. No hemorrhage. No extra-axial fluid collection. No hydrocephalus. There are chronic infarcts in the left parietal and right occipital lobes. There is also a chronic infarct in the superior left cerebellum. Mineralization of the basal ganglia on the left. Vascular: No hyperdense vessel or unexpected calcification. Skull: Normal. Negative for fracture or focal lesion.  Sinuses/Orbits: Trace bilateral mastoid effusions. No middle ear effusion. Paranasal sinuses are clear. Orbits are unremarkable. Other: None. IMPRESSION: 1. No acute intracranial abnormality. 2. Sequela of severe chronic microvascular ischemic change. Chronic infarcts in the left parietal lobe, right occipital lobe, and left superior cerebellum Electronically Signed   By: Lorenza Cambridge M.D.   On: 06/20/2022 16:33    Microbiology: No results found for this or any previous visit (from the past 240 hour(s)).   Labs: CBC: Recent Labs  Lab 06/21/22 0604 06/22/22 0515 06/23/22 0441 06/24/22 0432 06/25/22 0425  WBC 8.7 7.8 8.6 7.2 7.9  NEUTROABS 6.3  --   --   --   --   HGB 8.7* 7.6* 8.2* 7.9* 7.9*  HCT 26.3* 22.6* 25.1* 24.0* 24.0*  MCV 90.1 87.9 90.6 90.2 89.2  PLT 214 187 203 180 199   Basic Metabolic Panel: Recent Labs  Lab 06/21/22 0604 06/21/22 0927 06/22/22 0515 06/23/22 0441 06/24/22 0432 06/25/22 0425  NA 145  --  143 142 143 139  K 4.6  --  3.9 4.3 4.4 4.9  CL  120*  --  118* 119* 118* 114*  CO2 14*  --  17* 15* 18* 21*  GLUCOSE 92  --  95 98 165* 146*  BUN 56*  --  57* 56* 59* 62*  CREATININE 6.51*  --  6.33* 6.12* 6.18* 6.26*  CALCIUM 7.8*  --  7.6* 7.6* 7.5* 7.1*  MG  --  2.5* 2.5* 2.5* 2.4  --   PHOS  --  5.8* 5.8* 5.3* 4.6  --    Liver Function Tests: No results for input(s): "AST", "ALT", "ALKPHOS", "BILITOT", "PROT", "ALBUMIN" in the last 168 hours. No results for input(s): "LIPASE", "AMYLASE" in the last 168 hours. No results for input(s): "AMMONIA" in the last 168 hours. Cardiac Enzymes: No results for input(s): "CKTOTAL", "CKMB", "CKMBINDEX", "TROPONINI" in the last 168 hours. BNP (last 3 results) Recent Labs    10/24/21 1700 06/21/22 0604  BNP 645.4* 1,834.8*   CBG: Recent Labs  Lab 06/24/22 1143 06/24/22 1821 06/24/22 2001 06/25/22 0724 06/25/22 1145  GLUCAP 174* 154* 121* 84 98    Time spent: 35 minutes  Signed:  Gillis Santa  [facility] 06/25/2022 12:08 PM

## 2022-06-25 NOTE — TOC Transition Note (Signed)
Transition of Care [facility]) - CM/SW Discharge Note   Patient Details  Name: Courtney Holt MRN: [id number] Date of Birth: 26-Jun-1934  [facility] CM/SW Contact:  Allena Katz, LCSW Phone Number: 06/25/2022, 11:58 AM   Clinical Narrative:   Pt to discharge today to [facility]. RN given number for report. Marcelino Duster with [facility] notified. Pt going to room 509-A. CSW to send dc summary once in. Medical necessity printed to unit.     Final next level of care: Skilled Nursing Facility Barriers to Discharge: Barriers Resolved   Patient Goals and CMS Choice CMS Medicare.gov Compare Post Acute Care list provided to:: Patient Represenative (must comment) (anette) Choice offered to / list presented to : [facility] POA / Guardian  Discharge Placement                Patient chooses bed at: [facility] Patient to be transferred to facility by: [facility]      Discharge Plan and Services Additional resources added to the After Visit Summary for                                       Social Determinants of Health (SDOH) Interventions SDOH Screenings   Tobacco Use: High Risk (06/21/2022)     Readmission Risk Interventions     No data to display

## 2022-09-04 NOTE — Progress Notes (Deleted)
Referring Provider: Charlynne Pander, MD  Primary Care Physician:  Charlynne Pander, MD Primary Gastroenterologist:  Dr. Bonnetta Barry chief complaint on file.   HPI:   Courtney Holt is a 88 y.o. female presenting today at the request of Charlynne Pander, MD for blood in stool.   Per chart review, patient has had episodes of acute on chronic anemia over the years requiring PRBCs and seems that dating back at least to 2019, daughter has requested no endoscopic evaluation to be pursued. Most recently seen by [facility] while hospitalized in September 2023 for melena.  Offered EGD, but patient's daughter was reluctant to proceed with invasive evaluation and requested to continue to observe.  Recently admitted 06/22/2022 - 06/25/2022 with acute metabolic encephalopathy, AKI on CKD, hypertensive emergency, anemia with hemoglobin of 6.8, FOBT positive.  CT of the head and MRI of the brain did not show any acute findings to explain her mental status changes.  Creatinine improved minimally with IV fluids and bicarb, down to 6.26.  Patient's daughter stated she did not want patient to go on hemodialysis in the future.  Regarding her anemia, she had no obvious bleeding.  She did receive 1 unit PRBCs and was started on PPI twice daily, then transition to daily at discharge.  Hemoglobin was stable at 7.9 on day of discharge.  Iron was within normal limits and iron saturation was elevated at 35%.   ***    Rectal prolapse noted in September 2023 per ED physician that was reduced. ***  Past Medical History:  Diagnosis Date   Diabetes (HCC)    Hypertension    Rectal mass    Venous insufficiency (chronic) (peripheral)     No past surgical history on file.  Current Outpatient Medications  Medication Sig Dispense Refill   amLODipine (NORVASC) 10 MG tablet Take 10 mg by mouth daily.     carvedilol (COREG) 12.5 MG tablet Take 12.5 mg by mouth 2 (two) times daily. (Patient not taking: Reported on 06/20/2022)      pantoprazole (PROTONIX) 40 MG tablet Take 1 tablet (40 mg total) by mouth daily.     sodium bicarbonate 650 MG tablet Take 1 tablet (650 mg total) by mouth 2 (two) times daily. 60 tablet 11   Vitamin D, Cholecalciferol, 10 MCG (400 UNIT) TABS Take 2 tablets by mouth daily.     No current facility-administered medications for this visit.    Allergies as of 09/06/2022 - Review Complete 06/20/2022  Allergen Reaction Noted   Penicillins  08/26/2016    No family history on file.  Social History   Socioeconomic History   Marital status: Divorced    Spouse name: Not on file   Number of children: Not on file   Years of education: Not on file   Highest education level: Not on file  Occupational History   Not on file  Tobacco Use   Smoking status: Every Day   Smokeless tobacco: Never  Substance and Sexual Activity   Alcohol use: Yes   Drug use: No   Sexual activity: Not on file  Other Topics Concern   Not on file  Social History Narrative   Not on file   Social Determinants of Health   Financial Resource Strain: Not on file  Food Insecurity: Not on file  Transportation Needs: Not on file  Physical Activity: Not on file  Stress: Not on file  Social Connections: Not on file  Intimate Partner Violence: Not on file  Review of Systems: Gen: Denies any fever, chills, fatigue, weight loss, lack of appetite.  CV: Denies chest pain, heart palpitations, peripheral edema, syncope.  Resp: Denies shortness of breath at rest or with exertion. Denies wheezing or cough.  GI: Denies dysphagia or odynophagia. Denies jaundice, hematemesis, fecal incontinence. GU : Denies urinary burning, urinary frequency, urinary hesitancy MS: Denies joint pain, muscle weakness, cramps, or limitation of movement.  Derm: Denies rash, itching, dry skin Psych: Denies depression, anxiety, memory loss, and confusion Heme: Denies bruising, bleeding, and enlarged lymph nodes.  Physical Exam: There were no  vitals taken for this visit. General:   Alert and oriented. Pleasant and cooperative. Well-nourished and well-developed.  Head:  Normocephalic and atraumatic. Eyes:  Without icterus, sclera clear and conjunctiva pink.  Ears:  Normal auditory acuity. Lungs:  Clear to auscultation bilaterally. No wheezes, rales, or rhonchi. No distress.  Heart:  S1, S2 present without murmurs appreciated.  Abdomen:  +BS, soft, non-tender and non-distended. No HSM noted. No guarding or rebound. No masses appreciated.  Rectal:  Deferred  Msk:  Symmetrical without gross deformities. Normal posture. Extremities:  Without edema. Neurologic:  Alert and  oriented x4;  grossly normal neurologically. Skin:  Intact without significant lesions or rashes. Psych:  Alert and cooperative. Normal mood and affect.    Assessment:     Plan:  ***   Ermalinda Memos, PA-C [facility] 09/06/2022

## 2022-09-06 ENCOUNTER — Ambulatory Visit: Payer: Medicare Other | Admitting: Gastroenterology

## 2022-09-07 ENCOUNTER — Ambulatory Visit: Payer: Medicare Other | Admitting: Gastroenterology

## 2022-09-19 ENCOUNTER — Ambulatory Visit: Payer: Medicare Other | Admitting: Gastroenterology

## 2022-10-01 NOTE — Progress Notes (Signed)
GI Office Note    Referring Provider: Charlynne Pander, MD Primary Care Physician:  Charlynne Pander, MD  Primary Gastroenterologist: Hennie Duos. Marletta Lor, DO   Chief Complaint   Chief Complaint  Patient presents with   Blood In Stools    Bright red blood seen in stools for over a week now     History of Present Illness   Timiya Gergel is a 88 y.o. female presenting today at the request of Dr. Charlynne Pander for further evaluation of blood in the stool. Episode occurred one day per referral papers. Patient presents today with staff from SNF. No family present. Patient unable to give history. She is also very hard of hearing.   Staff present says he has taken care of patient recent. He knows of two occasions that brbpr has been reported, found in her diaper. Otherwise, he states there have been know issues with her eating. No reports of abdominal pain.   Review of records durint admission 09/2021. Patient seen by GI at that time for melena, brbpr. Daughter reported at that time that patient had colonoscopy at [facility] around 2000 that was normal. She also reported that patient had known rectal prolapse for several years and observation had been advised. During that admission, family choice conservative tretment of GI bleeding and did not want to pursue endoscopic evaluation.  Inpatient in 05/2022. Palliative care. Patient's daughter did not want to escalate care, does not want hemocialysis. Would like her comfortable and continue current medicaitons. If condition deteriorates then would consider comfort measures only. Received one unit of prbcs. Heme positive while in hostpial. Hgb 7.9 at discharge, 6.8 on admission.  Patient has history of receiving iron infusions at [facility] back in 2019.     Medications   Current Outpatient Medications  Medication Sig Dispense Refill   acetaminophen (TYLENOL) 325 MG tablet Take 650 mg by mouth every 6 (six) hours as needed for mild pain.     amLODipine (NORVASC) 10  MG tablet Take 10 mg by mouth daily.     carvedilol (COREG) 12.5 MG tablet Take 12.5 mg by mouth 2 (two) times daily.     diclofenac Sodium (VOLTAREN) 1 % GEL Apply 4 g topically in the morning and at bedtime.     Menthol-Methyl Salicylate (ANALGESIC BALM) 10-15 % CREA Apply topically in the morning, at noon, and at bedtime.     pantoprazole (PROTONIX) 40 MG tablet Take 1 tablet (40 mg total) by mouth daily.     sodium bicarbonate 650 MG tablet Take 1 tablet (650 mg total) by mouth 2 (two) times daily. 60 tablet 11   Vitamin D, Cholecalciferol, 10 MCG (400 UNIT) TABS Take 1 tablet by mouth once a week. Every Tuesday     No current facility-administered medications for this visit.    Allergies   Allergies as of 10/02/2022 - Review Complete 10/02/2022  Allergen Reaction Noted   Penicillins  08/26/2016    Past Medical History   Past Medical History:  Diagnosis Date   Diabetes (HCC)    Hypertension    Rectal mass    Venous insufficiency (chronic) (peripheral)     Past Surgical History   No past surgical history on file.  Past Family History   No family history on file.  Past Social History   Social History   Socioeconomic History   Marital status: Divorced    Spouse name: Not on file   Number of children: Not on file  Years of education: Not on file   Highest education level: Not on file  Occupational History   Not on file  Tobacco Use   Smoking status: Every Day   Smokeless tobacco: Never  Substance and Sexual Activity   Alcohol use: Yes   Drug use: No   Sexual activity: Not on file  Other Topics Concern   Not on file  Social History Narrative   Not on file   Social Determinants of Health   Financial Resource Strain: Not on file  Food Insecurity: Not on file  Transportation Needs: Not on file  Physical Activity: Not on file  Stress: Not on file  Social Connections: Not on file  Intimate Partner Violence: Not on file    Review of Systems   Unable  to obtain  Physical Exam   BP (!) 156/74 (BP Location: Left Arm, Patient Position: Sitting, Cuff Size: Normal)   Pulse (!) 55   Temp (!) 97.2 F (36.2 C) (Temporal)   Ht 5\' 1"  (1.549 m)   Wt 103 lb 11.2 oz (47 kg) Comment: facility reported 09/12/2022  SpO2 100%   BMI 19.59 kg/m    General: Well-nourished, well-developed in no acute distress.  Head: Normocephalic, atraumatic.   Eyes: Conjunctiva pink, no icterus. Mouth: Oropharyngeal mucosa moist and pink   Neck: Supple without thyromegaly, masses, or lymphadenopathy.  Lungs: Clear to auscultation bilaterally.  Heart: Regular rate and rhythm, no murmurs rubs or gallops.  Abdomen: Bowel sounds are normal, nontender, nondistended, no hepatosplenomegaly or masses,  no abdominal bruits or hernia, no rebound or guarding.   Rectal: approximately 1.5 inch in length mass protruding out of anal opening, could not be reduced. Possible rectal prolapse.  Extremities: No lower extremity edema. No clubbing or deformities.  Neuro: Alert and oriented x 4 , grossly normal neurologically.  Skin: Warm and dry, no rash or jaundice.   Psych: Alert and cooperative, normal mood and affect.     Labs   Lab Results  Component Value Date   WBC 7.9 06/25/2022   HGB 7.9 (L) 06/25/2022   HCT 24.0 (L) 06/25/2022   MCV 89.2 06/25/2022   PLT 199 06/25/2022   Lab Results  Component Value Date   IRON 83 06/21/2022   TIBC 238 (L) 06/21/2022   Lab Results  Component Value Date   VITAMINB12 1,227 (H) 06/21/2022   Lab Results  Component Value Date   FOLATE 8.9 06/21/2022    Imaging Studies   No results found.  Assessment   *Rectal bleeding *Abnormal perianal exam *Anemia  Per medical records, patient has long standing history of rectal prolapse for several years. Recent episodes of rectal bleeding noted by SNF staff. Suspect blood noted is coming from mass like finding noted on perianal exam which could be due to rectal prolapse. I will  touch base with Dr. Marletta Lor, review picture taken today. Will touch base with patient's daugther, Rhetta Mura, to discuss options and family's desire for any additional work up. They have previously desired conservative treatment across the board with her healthcare.   History of anemia, with prior needs for both IV iron and prbcs. Will update labs.    The patient was found to have elevated blood pressure when vital signs were checked in the office. The blood pressure was rechecked by the nursing staff and it was found be persistently elevated >140/90 mmHg. I personally advised to the patient to follow up closely with his PCP for hypertension control.  PLAN   To discuss findings with Dr. Marletta Lor and then reach out to daughter to discuss plan. Update CBC.    Leanna Battles. Melvyn Neth, MHS, PA-C [facility]

## 2022-10-02 ENCOUNTER — Ambulatory Visit (INDEPENDENT_AMBULATORY_CARE_PROVIDER_SITE_OTHER): Payer: Medicare Other | Admitting: Gastroenterology

## 2022-10-02 ENCOUNTER — Encounter: Payer: Self-pay | Admitting: Gastroenterology

## 2022-10-02 VITALS — BP 156/74 | HR 55 | Temp 97.2°F | Ht 61.0 in | Wt 103.7 lb

## 2022-10-02 DIAGNOSIS — K6289 Other specified diseases of anus and rectum: Secondary | ICD-10-CM

## 2022-10-02 DIAGNOSIS — K625 Hemorrhage of anus and rectum: Secondary | ICD-10-CM

## 2022-10-02 DIAGNOSIS — D649 Anemia, unspecified: Secondary | ICD-10-CM

## 2022-10-02 NOTE — Patient Instructions (Addendum)
CBC with diff. To discuss findings with daughter to determine desired work up of rectal mass, likely rectal prolapse but cannot exclude underlying malignancy.

## 2022-10-06 ENCOUNTER — Encounter: Payer: Self-pay | Admitting: Gastroenterology

## 2022-11-19 ENCOUNTER — Telehealth: Payer: Self-pay | Admitting: Gastroenterology

## 2022-11-19 NOTE — Telephone Encounter (Signed)
Please request labs from 10/02/22, likely completed at nursing home but we never received results.

## 2022-11-23 NOTE — Telephone Encounter (Signed)
I have requested September labs from [facility] again.

## 2023-01-03 ENCOUNTER — Telehealth (INDEPENDENT_AMBULATORY_CARE_PROVIDER_SITE_OTHER): Payer: Medicare Other | Admitting: *Deleted

## 2023-01-03 NOTE — Telephone Encounter (Signed)
Correction, number [phone]. Left message again this time asking her to call back tomorrow. Please let me know if patient calls back so I can talk with her.

## 2023-01-03 NOTE — Telephone Encounter (Signed)
Tried to call patient's daughter, Posey Rea.   Need to discuss with daughter, goals of care. Noted in numerous records that family have desired no aggressive/invasive care, previously refusing colonoscopy and hemodialysis etc.  Patient seen by me for rectal bleeding. Noted to have rectal prolpase (chronic finding) with irregular tissue possibly due to trauma but cannot rule out abnormal cells. Local surgery have recommended patient see colorectal surgeon in [place] but I am needing to know if family wants to pursue.  We have also requested labs from nursing home on several occasions without success.  She needs an updated CBC unless we find out family does not want further testing or if patient is hospice.Marland Kitchen

## 2023-01-03 NOTE — Telephone Encounter (Signed)
Returning your call --she left no other number to contact chart only has this number [phone]

## 2023-01-07 ENCOUNTER — Other Ambulatory Visit: Payer: Self-pay | Admitting: *Deleted

## 2023-01-07 ENCOUNTER — Telehealth (INDEPENDENT_AMBULATORY_CARE_PROVIDER_SITE_OTHER): Payer: Self-pay | Admitting: *Deleted

## 2023-01-07 DIAGNOSIS — K623 Rectal prolapse: Secondary | ICD-10-CM

## 2023-01-07 NOTE — Telephone Encounter (Signed)
Referral sent 

## 2023-01-07 NOTE — Telephone Encounter (Signed)
Spoke with Drinda Butts on Friday. See separate telephone note.

## 2023-01-07 NOTE — Telephone Encounter (Signed)
Voice mail left Friday to try to connect with you.  Please call her when you can --she applogized she missed your call

## 2023-01-07 NOTE — Telephone Encounter (Signed)
I spoke to patient's daughter Drinda Butts on Friday.  She confirmed patient's history of rectal prolapse dating back chronically into the 90s.  She states initially on they did reach out for management but on those occasions no surgical intervention was recommended or offered although she has not seen colorectal surgery.  Patient currently resides in skilled nursing facility, basically wheelchair-bound.  Previously patient and family declined colonoscopy during admissions for rectal bleeding as well as potential for hemodialysis.  Unfortunately I do not have any updated labs although we have requested several times from SNF.  After long discussion with patient's daughter, she is concerned that patient has quite a bit of discomfort associated with her rectal prolapse.  She is very active in her care at the nursing home, often assisting her to the restroom and the patient grunts and appears to be in significant pain with HBM although the stools are soft.  No recent rectal bleeding.  She would like for patient to be evaluated by colorectal surgery in [place].   Please refer to colorectal surgeon at [facility] for chronic symptomatic rectal prolapse with concern for abnormal tissue on digital rectal exam.  We also need an updated CBC, CMET. Never able to get results after last ov, but needs updating now regardless.

## 2023-01-09 NOTE — Telephone Encounter (Signed)
Provider spoke to pt's daughter

## 2023-01-09 NOTE — Telephone Encounter (Signed)
LMOM for pt to call office  

## 2023-01-09 NOTE — Telephone Encounter (Signed)
noted 

## 2023-01-10 NOTE — Telephone Encounter (Signed)
See other telephone note.  

## 2023-01-11 NOTE — Telephone Encounter (Signed)
No ans, no vm

## 2023-01-11 NOTE — Telephone Encounter (Signed)
Spoke with [name] from [facility] and she stated that she would get the information to the nurse to have pt update lab work as recommended.

## 2023-01-15 NOTE — Telephone Encounter (Signed)
Reviewed labs, dated 01/14/2023: Glucose 85, BUN 67, creatinine 8.51, sodium 142, potassium 5.9, total bilirubin 0.2, albumin 1.9, ALT 13, AST 20, alk phos 95, total bilirubin 0.2, white blood cell count 8400, hemoglobin 7.1, MCV 89.7, platelets 234,000  Please let daughter, Drinda Butts, know labs were completed. Kidney function worsened since 05/2022. Hemodialysis was previously declined when she was inpatient in 05/2022. Her Hgb is slightly worse went from 7.9 to 7.1.   She has pending referral to [facility] for rectal prolapse with abnormal rectal tissue on exam. Await recommendations.   As far as her Hgb, it is not low enough for blood transfusion. I suspect due to her decline in kidney function. Would recommend she discuss with PCP regarding management. They may advise monitoring vs giving injections that help with anemia due to kidney disease.   Please let nursing home know above.

## 2023-01-15 NOTE — Telephone Encounter (Signed)
Ok. You may have to call and ask for them to fax. Typically they put the orders under attendings name and I never see them.

## 2023-01-15 NOTE — Telephone Encounter (Signed)
Spoke with facility and pt had labs done yesterday. They are faxing over the results.

## 2023-01-15 NOTE — Telephone Encounter (Signed)
Results are scanned in under media in EPIC

## 2023-01-16 NOTE — Telephone Encounter (Signed)
noted 

## 2023-01-16 NOTE — Telephone Encounter (Signed)
Pt's daughter Drinda Butts was made aware and verbalized understanding.

## 2023-01-16 NOTE — Telephone Encounter (Signed)
Nurse Gabriel Rung at [facility] was made aware and stated that the pcp is aware of Hgb and is managing it.

## 2023-02-05 ENCOUNTER — Telehealth: Payer: Self-pay

## 2023-02-05 NOTE — Telephone Encounter (Signed)
 Pt's daughter called stating that the family does not want to pursue any procedures involving the pt's rectal prolapse including the referral to gen surgery.

## 2023-02-05 NOTE — Telephone Encounter (Signed)
 Noted. Thanks for the update.

## 2023-04-09 ENCOUNTER — Encounter: Payer: Self-pay | Admitting: General Surgery

## 2023-04-09 ENCOUNTER — Ambulatory Visit (INDEPENDENT_AMBULATORY_CARE_PROVIDER_SITE_OTHER): Admitting: General Surgery

## 2023-04-09 VITALS — BP 133/65 | HR 62 | Temp 98.2°F

## 2023-04-09 DIAGNOSIS — K623 Rectal prolapse: Secondary | ICD-10-CM

## 2023-04-09 NOTE — Patient Instructions (Signed)
 Follow-up with our office as needed.  Please call and ask to speak with a nurse if you develop questions or concerns.   Continue to keep bowel movements soft and easy.  Advised to pursue a goal of 25 to 30 g of fiber daily.  The majority of this may be through natural sources, advised to ensure a minimal daily fiber supplementation.  Various forms of supplements discussed.  Recommend Psyllium husk, with options of mixing with beverage or applesauce to make more tolerable. Strongly advised to consume more fluids(especially in proximity to fiber intake) and to ensure adequate hydration.   Watch color of urine to determine adequacy of hydration.  Clarity is pursued in urine output, and bowel activity that responds and corresponds to significant meal intake.   We need to avoid deferring having bowel movements, advised to take the time at the first sign of sensation, typically following meals, and in the morning.  The need to avoid more frequently, and the presence of flatus may indicate the need for bowel movement.  Do not defer for later.  Addition of MiraLAX (or its generic equivalent) may be needed ensure at least twice daily bowel movements.  If multiple doses of MiraLAX are necessary utilize them. Never skip a day... Do not tolerate a day without a bowel movement unless you are fasting.  To be regular, we must do the above EVERY day.   Soluble Fiber Dissolves in Water: Soluble fiber dissolves in water to form a gel-like substance. Slows Digestion: This type of fiber slows down digestion, which can help control blood sugar levels and lower cholesterol. Sources: Common sources include oats, beans, apples, citrus fruits, and psyllium. Benefits: Helps manage cholesterol levels. Aids in blood sugar control. Increases healthy gut bacteria, which can lower inflammation and improve digestion.  Insoluble Fiber Does Not Dissolve in Water: Insoluble fiber does not dissolve in water and remains mostly  intact as it passes through the digestive system. Adds Bulk to Stool: It adds bulk to stool, which helps promote regular bowel movements and prevent constipation. Sources: Common sources include whole grains, nuts, beans, and vegetables like cauliflower and potatoes. Benefits: Improves bowel health and regularity. Reduces the risk of colorectal conditions like hemorrhoids and diverticulitis. Supports insulin sensitivity in people with diabetes.  Both types of fiber are essential for overall health, and it's beneficial to include a 50/50 mix of both in your diet.

## 2023-04-09 NOTE — Progress Notes (Signed)
 Patient ID: Courtney Holt, female   DOB: October 08, 1934, 89 y.o.   MRN: [id number] CC: Rectal Prolapse History of Present Illness Courtney Holt is a 89 y.o. female with history of dementia, chronic kidney disease and rectal prolapse who is seen in consultation for rectal prolapse.  The patient has dementia and is unable to provide medical history.  Medical history is obtained from chart review from [facility] as well as me calling the daughter.  Per this information the patient has had rectal prolapse for over 30 years.  At one time she wanted a referral to colorectal surgery but the patient has continued to grow frail over the last several years.  When she was last evaluated for this back in the fall the decision was made given her comorbidities not to refer her to colorectal surgery or as the family did not want anything done for the rectal prolapse.  The patient's daughter reports that she continues to have bowel movements but does seem to have little bit of pain when she has bowel movements.  There is no reported nausea, vomiting or constipation.  The patient's med list does include Colace and sorbitol.  She does have a history of chronic kidney disease and the last labs that I's saw noted a creatinine of 8.  The patient's daughter reaffirms to me that she does not wish for her mother to be on dialysis  Past Medical History Past Medical History:  Diagnosis Date   Anemia    CKD (chronic kidney disease)    Diabetes (HCC)    Hard of hearing    HTN (hypertension)    Hypertension    Mild cognitive impairment    Rectal mass    rectal prolapse   Venous insufficiency (chronic) (peripheral)        No past surgical history on file.  Allergies  Allergen Reactions   Penicillins     Current Outpatient Medications  Medication Sig Dispense Refill   acetaminophen (TYLENOL) 325 MG tablet Take 650 mg by mouth every 6 (six) hours as needed for mild pain.     amLODipine (NORVASC) 10 MG tablet Take 10  mg by mouth daily.     carvedilol (COREG) 12.5 MG tablet Take 12.5 mg by mouth 2 (two) times daily.     Cholecalciferol 125 MCG (5000 UT) TABS Take 1 tablet by mouth daily.     docusate sodium (COLACE) 100 MG capsule Take 100 mg by mouth 2 (two) times daily.     furosemide (LASIX) 20 MG tablet Take 20 mg by mouth daily.     pantoprazole (PROTONIX) 40 MG tablet Take 1 tablet (40 mg total) by mouth daily.     diclofenac Sodium (VOLTAREN) 1 % GEL Apply 4 g topically in the morning and at bedtime.     Menthol-Methyl Salicylate (ANALGESIC BALM) 10-15 % CREA Apply topically in the morning, at noon, and at bedtime.     No current facility-administered medications for this visit.    Family History No family history on file.     Social History Social History   Tobacco Use   Smoking status: Every Day    Passive exposure: Past   Smokeless tobacco: Never  Vaping Use   Vaping status: Never Used  Substance Use Topics   Alcohol use: Yes   Drug use: No        ROS Full ROS of systems performed and is otherwise negative there than what is stated in the HPI  Physical  Exam Blood pressure 133/65, pulse 62, temperature 98.2 F (36.8 C), SpO2 99%.  Patient is very frail appearing and sitting up in a wheelchair.  She does speak some phrases.  She says that it is cold.  She appears to understand what I am saying but does not respond.  I was unable to ask her orientation questions.  She is in no acute distress, PERRLA, upper lower extremity contractions.  I did move her onto our exam room table and examined her anus in the presence of a chaperone.  There is a chronic rectal prolapse.  There is stool within her diaper.  There is no evidence of ischemia or necrosis to the prolapsed tissue.  Data Reviewed I reviewed her last labs and she has chronic anemia with hemoglobin of 7.  She also has a creatinine of over 8.  Her albumin is below 2.  I reviewed her notes from the GI evaluation back in the fall.   At that time the family did not want to pursue any surgical intervention.  I also reviewed her med list from her facility.  She is on stool softener and sorbitol but does not appear to be on any MiraLAX.  I have personally reviewed the patient's imaging and medical records.    Assessment/Plan    Patient is 89 year old with dementia and chronic kidney disease with rectal prolapse.  There is no evidence of ischemia or necrosis.  The family does not want to pursue any operative intervention.  Recommend continued stool softener and MiraLAX as needed for constipation.  She can follow-up as needed      Kandis Cocking 04/09/2023, 2:04 PM

## 2023-05-04 ENCOUNTER — Inpatient Hospital Stay
Admission: EM | Admit: 2023-05-04 | Discharge: 2023-05-05 | DRG: 951 | Disposition: A | Discharge status: Hospice | Attending: Osteopathic Medicine | Admitting: Osteopathic Medicine

## 2023-05-04 ENCOUNTER — Other Ambulatory Visit: Payer: Self-pay

## 2023-05-04 DIAGNOSIS — Z515 Encounter for palliative care: Secondary | ICD-10-CM | POA: Diagnosis not present

## 2023-05-04 DIAGNOSIS — R4182 Altered mental status, unspecified: Secondary | ICD-10-CM | POA: Diagnosis present

## 2023-05-04 DIAGNOSIS — R627 Adult failure to thrive: Secondary | ICD-10-CM | POA: Diagnosis present

## 2023-05-04 DIAGNOSIS — F0390 Unspecified dementia without behavioral disturbance: Secondary | ICD-10-CM | POA: Diagnosis present

## 2023-05-04 DIAGNOSIS — R34 Anuria and oliguria: Secondary | ICD-10-CM | POA: Diagnosis present

## 2023-05-04 DIAGNOSIS — G9341 Metabolic encephalopathy: Principal | ICD-10-CM | POA: Diagnosis present

## 2023-05-04 DIAGNOSIS — I120 Hypertensive chronic kidney disease with stage 5 chronic kidney disease or end stage renal disease: Secondary | ICD-10-CM | POA: Diagnosis present

## 2023-05-04 DIAGNOSIS — H9190 Unspecified hearing loss, unspecified ear: Secondary | ICD-10-CM | POA: Diagnosis present

## 2023-05-04 DIAGNOSIS — E119 Type 2 diabetes mellitus without complications: Secondary | ICD-10-CM | POA: Diagnosis present

## 2023-05-04 DIAGNOSIS — Z880 Allergy status to penicillin: Secondary | ICD-10-CM

## 2023-05-04 DIAGNOSIS — N179 Acute kidney failure, unspecified: Secondary | ICD-10-CM | POA: Diagnosis present

## 2023-05-04 DIAGNOSIS — Z66 Do not resuscitate: Secondary | ICD-10-CM | POA: Diagnosis present

## 2023-05-04 DIAGNOSIS — Z79899 Other long term (current) drug therapy: Secondary | ICD-10-CM | POA: Diagnosis not present

## 2023-05-04 DIAGNOSIS — N186 End stage renal disease: Secondary | ICD-10-CM | POA: Diagnosis present

## 2023-05-04 DIAGNOSIS — R630 Anorexia: Secondary | ICD-10-CM | POA: Diagnosis present

## 2023-05-04 DIAGNOSIS — T68XXXA Hypothermia, initial encounter: Secondary | ICD-10-CM | POA: Diagnosis present

## 2023-05-04 DIAGNOSIS — E162 Hypoglycemia, unspecified: Secondary | ICD-10-CM

## 2023-05-04 LAB — URINALYSIS, ROUTINE W REFLEX MICROSCOPIC
Bacteria, UA: NONE SEEN
Bilirubin Urine: NEGATIVE
Glucose, UA: NEGATIVE mg/dL
Hgb urine dipstick: NEGATIVE
Ketones, ur: NEGATIVE mg/dL
Leukocytes,Ua: NEGATIVE
Nitrite: NEGATIVE
Protein, ur: 30 mg/dL — AB
Specific Gravity, Urine: 1.013 (ref 1.005–1.030)
pH: 5 (ref 5.0–8.0)

## 2023-05-04 LAB — CBC
HCT: 18.9 % — ABNORMAL LOW (ref 36.0–46.0)
Hemoglobin: 6.3 g/dL — ABNORMAL LOW (ref 12.0–15.0)
MCH: 30.4 pg (ref 26.0–34.0)
MCHC: 33.3 g/dL (ref 30.0–36.0)
MCV: 91.3 fL (ref 80.0–100.0)
Platelets: 182 10*3/uL (ref 150–400)
RBC: 2.07 MIL/uL — ABNORMAL LOW (ref 3.87–5.11)
RDW: 19.4 % — ABNORMAL HIGH (ref 11.5–15.5)
WBC: 5.8 10*3/uL (ref 4.0–10.5)
nRBC: 0 % (ref 0.0–0.2)

## 2023-05-04 LAB — CBG MONITORING, ED: Glucose-Capillary: 143 mg/dL — ABNORMAL HIGH (ref 70–99)

## 2023-05-04 LAB — COMPREHENSIVE METABOLIC PANEL WITH GFR
ALT: 11 U/L (ref 0–44)
AST: 18 U/L (ref 15–41)
Albumin: <1.5 g/dL — ABNORMAL LOW (ref 3.5–5.0)
Alkaline Phosphatase: 51 U/L (ref 38–126)
Anion gap: 9 (ref 5–15)
BUN: 64 mg/dL — ABNORMAL HIGH (ref 8–23)
CO2: 19 mmol/L — ABNORMAL LOW (ref 22–32)
Calcium: 6.9 mg/dL — ABNORMAL LOW (ref 8.9–10.3)
Chloride: 111 mmol/L (ref 98–111)
Creatinine, Ser: 7.84 mg/dL — ABNORMAL HIGH (ref 0.44–1.00)
GFR, Estimated: 5 mL/min — ABNORMAL LOW (ref >60)
Glucose, Bld: 151 mg/dL — ABNORMAL HIGH (ref 70–99)
Potassium: 4.9 mmol/L (ref 3.5–5.1)
Sodium: 139 mmol/L (ref 135–145)
Total Bilirubin: 0.5 mg/dL (ref 0.0–1.2)
Total Protein: 4.6 g/dL — ABNORMAL LOW (ref 6.5–8.1)

## 2023-05-04 MED ORDER — ONDANSETRON 4 MG PO TBDP
4.0000 mg | ORAL_TABLET | Freq: Four times a day (QID) | ORAL | Status: DC | PRN
Start: 2023-05-04 — End: 2023-05-05

## 2023-05-04 MED ORDER — ONDANSETRON HCL 4 MG/2ML IJ SOLN: 4.0000 mg | Freq: Four times a day (QID) | INTRAMUSCULAR | Status: DC | PRN

## 2023-05-04 MED ORDER — ACETAMINOPHEN 325 MG PO TABS: 650.0000 mg | ORAL_TABLET | Freq: Four times a day (QID) | ORAL | Status: DC | PRN

## 2023-05-04 MED ORDER — SODIUM CHLORIDE 0.9% FLUSH
3.0000 mL | Freq: Two times a day (BID) | INTRAVENOUS | Status: DC
  Administered 2023-05-05: 3 mL via INTRAVENOUS

## 2023-05-04 MED ORDER — POLYVINYL ALCOHOL 1.4 % OP SOLN: 1.0000 [drp] | Freq: Four times a day (QID) | OPHTHALMIC | Status: DC | PRN

## 2023-05-04 MED ORDER — SODIUM CHLORIDE 0.9% FLUSH: 3.0000 mL | INTRAVENOUS | Status: DC | PRN

## 2023-05-04 MED ORDER — MORPHINE SULFATE (PF) 2 MG/ML IV SOLN
2.0000 mg | INTRAVENOUS | Status: DC | PRN
  Administered 2023-05-05 (×3): 2 mg via INTRAVENOUS
  Filled 2023-05-04 (×3): qty 1

## 2023-05-04 MED ORDER — ACETAMINOPHEN 650 MG RE SUPP: 650.0000 mg | Freq: Four times a day (QID) | RECTAL | Status: DC | PRN

## 2023-05-04 MED ORDER — LORAZEPAM 2 MG/ML IJ SOLN
2.0000 mg | INTRAMUSCULAR | Status: DC | PRN
  Administered 2023-05-05: 2 mg via INTRAVENOUS
  Filled 2023-05-04: qty 1

## 2023-05-04 MED ORDER — GLYCOPYRROLATE 0.2 MG/ML IJ SOLN
0.2000 mg | INTRAMUSCULAR | Status: DC | PRN
  Filled 2023-05-04: qty 1

## 2023-05-04 MED ORDER — HALOPERIDOL LACTATE 5 MG/ML IJ SOLN: 2.5000 mg | INTRAMUSCULAR | Status: DC | PRN

## 2023-05-04 MED ORDER — GLYCOPYRROLATE 0.2 MG/ML IJ SOLN: 0.2000 mg | INTRAMUSCULAR | Status: DC | PRN

## 2023-05-04 MED ORDER — GLYCOPYRROLATE 1 MG PO TABS: 1.0000 mg | ORAL_TABLET | ORAL | Status: DC | PRN

## 2023-05-04 NOTE — ED Notes (Signed)
 Pt not answering any questions, only saying "I'm cold, get me warm" over and over again.

## 2023-05-04 NOTE — ED Triage Notes (Signed)
 Pt BIB [facility] from [facility] for AMS. Pt has not been eating or urinating for the last 2 days as wel as becoming increasingly more difficult to wake per EMS. Pt will wake on painful stimuli. EMS reports CBG of 40, was given of D10 with CBG rising to 170. Pt is in end renal failure. Vitals with EMS: HR 50's dropping into 20's with PVC. BP 120/68. CBG here is 143.

## 2023-05-04 NOTE — Group Note (Deleted)
 Date:  05/04/2023 Time:  9:53 PM  Group Topic/Focus:  Wrap-Up Group:   The focus of this group is to help patients review their daily goal of treatment and discuss progress on daily workbooks.     Participation Level:  {BHH PARTICIPATION WUJWJ:19147}  Participation Quality:  {BHH PARTICIPATION QUALITY:22265}  Affect:  {BHH AFFECT:22266}  Cognitive:  {BHH COGNITIVE:22267}  Insight: {BHH Insight2:20797}  Engagement in Group:  {BHH ENGAGEMENT IN WGNFA:21308}  Modes of Intervention:  {BHH MODES OF INTERVENTION:22269}  Additional Comments:  ***  Maglione,Angell Honse E 05/04/2023, 9:53 PM

## 2023-05-04 NOTE — ED Notes (Signed)
 Pt unconnected from monitoring equipment

## 2023-05-04 NOTE — ED Provider Notes (Signed)
 [facility] Provider Note    Event Date/Time   First MD Initiated Contact with Patient 05/04/23 1502     (approximate)   History   Altered Mental Status   HPI  Courtney Holt is a 89 y.o. female past medical history significant for dementia, CKD, rectal prolapse, presents to the emergency department with altered mental status.  Called out from [facility] for altered mental status.  Patient has not been eating, drinking or urinating for the multiple days.  Patient was found to only wake to painful stimuli.  Glucose was 40 when they arrived and she was given a D10 bolus with improvement to 170.  Her son at bedside states that she is just been sleeping all the time and states that she is cold.  States that his sister normally does most of her care and she is out of town so he came up here for assistance.  On chart review I can see prior notes from recent general surgery that noted that she did not want to pursue dialysis or any significant interventions.  Patient arrives with a MOST form with comfort care only checked.  Patient is unable to provide any further history.       Physical Exam   Triage Vital Signs: ED Triage Vitals  Encounter Vitals Group     BP 05/04/23 1435 (!) 122/48     Systolic BP Percentile --      Diastolic BP Percentile --      Pulse Rate 05/04/23 1430 (!) 58     Resp 05/04/23 1435 (!) 21     Temp 05/04/23 1435 (!) 94.4 F (34.7 C)     Temp Source 05/04/23 1435 Rectal     SpO2 05/04/23 1435 100 %     Weight 05/04/23 1432 104 lb 11.5 oz (47.5 kg)     Height --      Head Circumference --      Peak Flow --      Pain Score --      Pain Loc --      Pain Education --      Exclude from Growth Chart --     Most recent vital signs: Vitals:   05/04/23 1430 05/04/23 1435  BP:  (!) 122/48  Pulse: (!) 58 (!) 59  Resp:  (!) 21  Temp:  (!) 94.4 F (34.7 C)  SpO2:  100%    Physical Exam Constitutional:      Appearance: She is  well-developed. She is ill-appearing.     Comments: Thin, cachectic with temporal wasting  HENT:     Head: Atraumatic.     Mouth/Throat:     Mouth: Mucous membranes are dry.  Eyes:     Conjunctiva/sclera: Conjunctivae normal.     Pupils: Pupils are equal, round, and reactive to light.  Cardiovascular:     Rate and Rhythm: Regular rhythm. Bradycardia present.  Pulmonary:     Effort: No respiratory distress.     Breath sounds: No wheezing.  Abdominal:     General: There is no distension.     Tenderness: There is no abdominal tenderness.  Musculoskeletal:        General: Normal range of motion.     Cervical back: Normal range of motion.  Skin:    General: Skin is warm.  Neurological:     Mental Status: Mental status is at baseline. She is disoriented.     IMPRESSION / MDM / ASSESSMENT  AND PLAN / ED COURSE  I reviewed the triage vital signs and the nursing notes.  On arrival patient was hypothermic, bradycardic with tachypnea.  100% on room air and normotensive.  Patient received D10 infusion given significantly low blood glucose.  Arrives with a MOST form with comfort care measures only.  Patient was placed on a Bair hugger on arrival.  Sinus bradycardia while on cardiac telemetry. LABS (all labs ordered are listed, but only abnormal results are displayed) Labs interpreted as -    Labs Reviewed  COMPREHENSIVE METABOLIC PANEL WITH GFR - Abnormal; Notable for the following components:      Result Value   CO2 19 (*)    Glucose, Bld 151 (*)    BUN 64 (*)    Creatinine, Ser 7.84 (*)    Calcium 6.9 (*)    Total Protein 4.6 (*)    Albumin <1.5 (*)    GFR, Estimated 5 (*)    All other components within normal limits  CBC - Abnormal; Notable for the following components:   RBC 2.07 (*)    Hemoglobin 6.3 (*)    HCT 18.9 (*)    RDW 19.4 (*)    All other components within normal limits  URINALYSIS, ROUTINE W REFLEX MICROSCOPIC - Abnormal; Notable for the following components:    Color, Urine YELLOW (*)    APPearance CLEAR (*)    Protein, ur 30 (*)    All other components within normal limits  CBG MONITORING, ED - Abnormal; Notable for the following components:   Glucose-Capillary 143 (*)    All other components within normal limits  CBG MONITORING, ED     MDM  Patient has acute on chronic kidney injury with creatinine elevated up to 7.8 from a baseline of 6 with an elevated BUN that has been stable at 64.  Patient did have a urine here so does make some urine that showed no signs of urinary tract infection.  CO2 appears chronically low.  Albumin level is undetectable.  Hemoglobin has dropped to 6.3 from a baseline of 7, likely secondary to renal disease and chronic disease.   Clinical Course as of 05/04/23 1738  Sat May 04, 2023  1726 Discussed with daughter, annette, at bedside -ultimately would like to pursue hospice measures.  Does not want any further treatment.  Will consult hospice team. [SM]  1737 Discussed with hospice team with the nurse liaison, stated that they would be unable to put the patient on hospice tonight or have a hospice team contact her tonight.  Will admit the patient to the hospital for comfort care measures. [SM]    Clinical Course User Index [SM] Corena Herter, MD     PROCEDURES:  Critical Care performed: yes  .Critical Care  Performed by: Corena Herter, MD Authorized by: Corena Herter, MD   Critical care provider statement:    Critical care time (minutes):  30   Critical care time was exclusive of:  Separately billable procedures and treating other patients   Critical care was time spent personally by me on the following activities:  Development of treatment plan with patient or surrogate, discussions with consultants, evaluation of patient's response to treatment, examination of patient, ordering and review of laboratory studies, ordering and review of radiographic studies, ordering and performing treatments and  interventions, pulse oximetry, re-evaluation of patient's condition and review of old charts   Patient's presentation is most consistent with acute presentation with potential threat to life or bodily function.  MEDICATIONS ORDERED IN ED: Medications - No data to display  FINAL CLINICAL IMPRESSION(S) / ED DIAGNOSES   Final diagnoses:  Altered mental status, unspecified altered mental status type  AKI (acute kidney injury) (HCC)  Hypoglycemia     Rx / DC Orders   ED Discharge Orders     None        Note:  This document was prepared using Dragon voice recognition software and may include unintentional dictation errors.   Corena Herter, MD 05/04/23 [phone]

## 2023-05-04 NOTE — H&P (Signed)
 HISTORY AND PHYSICAL    Courtney Holt   AOZ:[id number] DOB: 14-Dec-1934   Date of Service: 05/04/23 Requesting physician/APP from ED: Treatment Team:  Attending Provider: Sunnie Nielsen, DO  PCP: Charlynne Pander, MD     HPI: Courtney Holt is a 89 y.o. female w/ PMH  has a past medical history of Anemia, CKD (chronic kidney disease), Diabetes (HCC), Hard of hearing, HTN (hypertension), Hypertension, Mild cognitive impairment, Rectal mass, and Venous insufficiency (chronic) (peripheral). She was brought to ED 05/04/23 from [facility] for altered mental status, progressive anorexia, oliguria, somnolence. Per EMS hypoglycemic, bradycardic, and only responsive to painful stimuli. Of note, pt has previously declined dialysis or other invasive interventions. In ED noted hypothermic, Cr 7.8, Hgb 6.3, minimally responsive. MOST form w/ comfort measures only designation. EDP spoke w/ daughter at bedside - plan for comfort measures admission after EDP attempted to reach out to hospice but they are not able to arrange care now.       Consultants:  None  Procedures: none      ASSESSMENT & PLAN:   Comfort measures only status ESRD Hypothermia Bradycardia Hypotension Acute metabolic encephalopathy Anemia Failure to thrive Morphine prn pain, can transition to infusion as needed if more severe discomfort, agitation, air hunger  Ativan prn anxiety Haldol prn agitation Robinul prn secretions No IV fluids No O2 support Ok to maintain Lawyer as desired / warm blankets  Unrestricted visitation status No non-comfort medications No lab craws or other diagnostics OK for Foley  Hospice to follow in AM, confirmed w/ representative this evening     DVT prophylaxis: n/a Pertinent IV fluids/nutrition: maintain IV access but no fluids needed Nutrition: Food as desired, aspiration precautions if so but pt has not been eating, expect this may continue  Central lines / invasive  devices: ok for Foley if needed  Code Status: DNR / comfort measures Family Communication: confirmed above plan w/ daughter at bedside, all questions answered  Disposition: inpatient  TOC needs: expect may need transport to [facility] if hospice services there, possibly to inpatient unit, will assess tomorrow  Barriers to discharge / significant pending items: comfort measures, IV medications                   Review of Systems:  Review of Systems  Unable to perform ROS: Critical illness       has a past medical history of Anemia, CKD (chronic kidney disease), Diabetes (HCC), Hard of hearing, HTN (hypertension), Hypertension, Mild cognitive impairment, Rectal mass, and Venous insufficiency (chronic) (peripheral).  No current facility-administered medications on file prior to encounter.   Current Outpatient Medications on File Prior to Encounter  Medication Sig Dispense Refill   acetaminophen (TYLENOL) 325 MG tablet Take 650 mg by mouth every 6 (six) hours as needed for mild pain.     amLODipine (NORVASC) 10 MG tablet Take 10 mg by mouth daily.     carvedilol (COREG) 12.5 MG tablet Take 12.5 mg by mouth 2 (two) times daily.     Cholecalciferol 125 MCG (5000 UT) TABS Take 1 tablet by mouth daily.     diclofenac Sodium (VOLTAREN) 1 % GEL Apply 4 g topically in the morning and at bedtime.     docusate sodium (COLACE) 100 MG capsule Take 100 mg by mouth 2 (two) times daily.     furosemide (LASIX) 20 MG tablet Take 20 mg by mouth daily.     Menthol-Methyl Salicylate (ANALGESIC BALM) 10-15 % CREA  Apply topically in the morning, at noon, and at bedtime.     pantoprazole (PROTONIX) 40 MG tablet Take 1 tablet (40 mg total) by mouth daily.       Allergies  Allergen Reactions   Penicillins       family history is not on file.  No past surgical history on file.        Objective Findings:  Vitals:   05/04/23 1430 05/04/23 1432 05/04/23 1435  BP:   (!) 122/48   Pulse: (!) 58  (!) 59  Resp:   (!) 21  Temp:   (!) 94.4 F (34.7 C)  TempSrc:   Rectal  SpO2:   100%  Weight:  47.5 kg    No intake or output data in the 24 hours ending 05/04/23 1804 Filed Weights   05/04/23 1432  Weight: 47.5 kg    Examination:  Physical Exam Constitutional:      General: She is sleeping. She is not in acute distress.    Appearance: She is ill-appearing.  HENT:     Head: Normocephalic and atraumatic.  Cardiovascular:     Rate and Rhythm: Normal rate and regular rhythm.  Pulmonary:     Effort: No tachypnea, accessory muscle usage or respiratory distress.  Neurological:     Mental Status: She is lethargic.          Scheduled Medications:   sodium chloride flush  3-10 mL Intravenous Q12H    Continuous Infusions:   PRN Medications:  acetaminophen **OR** acetaminophen, glycopyrrolate **OR** glycopyrrolate **OR** glycopyrrolate, haloperidol lactate, LORazepam, morphine injection, ondansetron **OR** ondansetron (ZOFRAN) IV, polyvinyl alcohol, sodium chloride flush  Antimicrobials:  Anti-infectives (From admission, onward)    None           Data Reviewed: I have personally reviewed following labs and imaging studies  CBC: Recent Labs  Lab 05/04/23 1425  WBC 5.8  HGB 6.3*  HCT 18.9*  MCV 91.3  PLT 182   Basic Metabolic Panel: Recent Labs  Lab 05/04/23 1425  NA 139  K 4.9  CL 111  CO2 19*  GLUCOSE 151*  BUN 64*  CREATININE 7.84*  CALCIUM 6.9*   GFR: CrCl cannot be calculated (Unknown ideal weight.). Liver Function Tests: Recent Labs  Lab 05/04/23 1425  AST 18  ALT 11  ALKPHOS 51  BILITOT 0.5  PROT 4.6*  ALBUMIN <1.5*   No results for input(s): "LIPASE", "AMYLASE" in the last 168 hours. No results for input(s): "AMMONIA" in the last 168 hours. Coagulation Profile: No results for input(s): "INR", "PROTIME" in the last 168 hours. Cardiac Enzymes: No results for input(s): "CKTOTAL", "CKMB", "CKMBINDEX",  "TROPONINI" in the last 168 hours. BNP (last 3 results) No results for input(s): "PROBNP" in the last 8760 hours. HbA1C: No results for input(s): "HGBA1C" in the last 72 hours. CBG: Recent Labs  Lab 05/04/23 1424  GLUCAP 143*   Lipid Profile: No results for input(s): "CHOL", "HDL", "LDLCALC", "TRIG", "CHOLHDL", "LDLDIRECT" in the last 72 hours. Thyroid Function Tests: No results for input(s): "TSH", "T4TOTAL", "FREET4", "T3FREE", "THYROIDAB" in the last 72 hours. Anemia Panel: No results for input(s): "VITAMINB12", "FOLATE", "FERRITIN", "TIBC", "IRON", "RETICCTPCT" in the last 72 hours. Most Recent Urinalysis On File:     Component Value Date/Time   COLORURINE YELLOW (A) 05/04/2023 1449   APPEARANCEUR CLEAR (A) 05/04/2023 1449   LABSPEC 1.013 05/04/2023 1449   PHURINE 5.0 05/04/2023 1449   GLUCOSEU NEGATIVE 05/04/2023 1449   HGBUR NEGATIVE 05/04/2023 1449  BILIRUBINUR NEGATIVE 05/04/2023 1449   KETONESUR NEGATIVE 05/04/2023 1449   PROTEINUR 30 (A) 05/04/2023 1449   NITRITE NEGATIVE 05/04/2023 1449   LEUKOCYTESUR NEGATIVE 05/04/2023 1449   Sepsis Labs: @LABRCNTIP (procalcitonin:4,lacticidven:4)  No results found for this or any previous visit (from the past 240 hours).       Radiology Studies: No results found.           LOS: 0 days       Sunnie Nielsen, DO [facility] 05/04/2023, 6:04 PM    Dictation software may have been used to generate the above note. Typos may occur and escape review in typed/dictated notes. Please contact Dr Lyn Hollingshead directly for clarity if needed.  Staff may message me via secure chat in Epic  but this may not receive an immediate response,  please page me for urgent matters!  If 7PM-7AM, please contact night coverage [email or web address]

## 2023-05-05 DIAGNOSIS — Z515 Encounter for palliative care: Secondary | ICD-10-CM | POA: Diagnosis not present

## 2023-05-05 NOTE — Progress Notes (Signed)
 Patient sleeping soundly. Patient clean and dry and wearing a brief. Patient repositioned in bed now that she is comfortable and sleeping.

## 2023-05-05 NOTE — Progress Notes (Addendum)
 Attempted to call report twice at [phone]. Marland Kitchen No answer at this time.

## 2023-05-05 NOTE — Progress Notes (Signed)
 Patient still sleeping in bed comfortably.

## 2023-05-05 NOTE — Progress Notes (Addendum)
 Patient thrashing in bed. Patient throwing legs against bedrails and grabbing at this RN when close to bed. Patient unable to communicate needs.

## 2023-05-05 NOTE — Progress Notes (Signed)
 [facility] LIAISON NOTE   Received request from Susa Simmonds, Transitions of Care Manager, for evaluation for hospice InPatient Unit [facility]).  Spoke with patient's daughter, Rhetta Mura to initiate education related to hospice philosophy, services, and team approach to care. Patient/family verbalized understanding of information given and understands patient will need to meet IPU criteria and that will be determined by our hospice physician.   Received notification from Dr. Kern Reap, hospice MD that patient meets criteria for GIP for symptom management of pain, agitation & shortness of breath.  Notified [name].  [facility] RN to call report to the Hospice Home at [phone]  ED will arrange EMS transport at discharge.  Please medicate the patient prior to EMS transport as needed for comfort during transport.  Please send signed and completed DNR with patient at discharge.  [facility] information and contact numbers given to Rhetta Mura, daughter & guardian.  Above information shared with , Susa Simmonds, Transitions of Care Manager and hospital medical care team.   Thank you for the opportunity to participate in this patient's care.   Norris Cross, MA, BSN, RN, FNE Nurse Liaison [phone]

## 2023-05-05 NOTE — Discharge Summary (Signed)
 Physician Discharge Summary   Patient: Courtney Holt MRN: [id number]  DOB: Aug 04, 1934   Admit:     Date of Admission: 05/04/2023 Admitted from: assisted living    Discharge: Date of discharge: 05/05/23 Disposition: Hospice care Condition at discharge: stable  CODE STATUS: DNR     Discharge Physician: Sunnie Nielsen, DO [facility]     PCP: Charlynne Pander, MD  Recommendations for Outpatient Follow-up:  Per hospice    Discharge Instructions     Diet - low sodium heart healthy   Complete by: As directed    Discharge wound care:   Complete by: As directed    Prn comfort measures   Increase activity slowly   Complete by: As directed          Discharge Diagnoses: Principal Problem:   Comfort measures only status       Hospital Course:  Courtney Holt is a 89 y.o. female w/ PMH  has a past medical history of Anemia, CKD (chronic kidney disease), Diabetes (HCC), Hard of hearing, HTN (hypertension), Hypertension, Mild cognitive impairment, Rectal mass, and Venous insufficiency (chronic) (peripheral). She was brought to ED 05/04/23 from [facility] for altered mental status, progressive anorexia, oliguria, somnolence. Per EMS hypoglycemic, bradycardic, and only responsive to painful stimuli. Of note, pt has previously declined dialysis or other invasive interventions. In ED noted hypothermic, Cr 7.8, Hgb 6.3, minimally responsive. MOST form w/ comfort measures only designation. EDP spoke w/ daughter at bedside - plan for comfort measures admission after EDP attempted to reach out to hospice but they are not able to arrange care now. Pt was admitted on comfort measures. Hospice evaluated and plan for transfer to inpatient hospice care today        Discharge Instructions  Allergies as of 05/05/2023       Reactions   Penicillins         Medication List     STOP taking these medications    acetaminophen 325 MG tablet Commonly known as:  TYLENOL   amLODipine 10 MG tablet Commonly known as: NORVASC   Analgesic Balm 10-15 % Crea   carvedilol 12.5 MG tablet Commonly known as: COREG   Cholecalciferol 125 MCG (5000 UT) Tabs   diclofenac Sodium 1 % Gel Commonly known as: VOLTAREN   docusate sodium 100 MG capsule Commonly known as: COLACE   furosemide 20 MG tablet Commonly known as: LASIX   lisinopril 10 MG tablet Commonly known as: ZESTRIL   pantoprazole 40 MG tablet Commonly known as: PROTONIX   potassium chloride 10 MEQ tablet Commonly known as: KLOR-CON               Discharge Care Instructions  (From admission, onward)           Start     Ordered   05/05/23 0000  Discharge wound care:       Comments: Prn comfort measures   05/05/23 1232              Allergies  Allergen Reactions   Penicillins      Subjective: pt unable to contribute    Discharge Exam: BP (!) 117/44   Pulse 78   Temp (!) 97.5 F (36.4 C) (Axillary)   Resp (!) 22   Wt 47.5 kg   SpO2 99%   BMI 19.79 kg/m  General: Pt is not in acute distress Cardiovascular: RRR Respiratory: CTA bilaterally Abdominal: Soft     The results of significant  diagnostics from this hospitalization (including imaging, microbiology, ancillary and laboratory) are listed below for reference.     Microbiology: No results found for this or any previous visit (from the past 240 hours).   Labs: BNP (last 3 results) Recent Labs    06/21/22 0604  BNP 1,834.8*   Basic Metabolic Panel: Recent Labs  Lab 05/04/23 1425  NA 139  K 4.9  CL 111  CO2 19*  GLUCOSE 151*  BUN 64*  CREATININE 7.84*  CALCIUM 6.9*   Liver Function Tests: Recent Labs  Lab 05/04/23 1425  AST 18  ALT 11  ALKPHOS 51  BILITOT 0.5  PROT 4.6*  ALBUMIN <1.5*   No results for input(s): "LIPASE", "AMYLASE" in the last 168 hours. No results for input(s): "AMMONIA" in the last 168 hours. CBC: Recent Labs  Lab 05/04/23 1425  WBC 5.8  HGB  6.3*  HCT 18.9*  MCV 91.3  PLT 182   Cardiac Enzymes: No results for input(s): "CKTOTAL", "CKMB", "CKMBINDEX", "TROPONINI" in the last 168 hours. BNP: Invalid input(s): "POCBNP" CBG: Recent Labs  Lab 05/04/23 1424  GLUCAP 143*   D-Dimer No results for input(s): "DDIMER" in the last 72 hours. Hgb A1c No results for input(s): "HGBA1C" in the last 72 hours. Lipid Profile No results for input(s): "CHOL", "HDL", "LDLCALC", "TRIG", "CHOLHDL", "LDLDIRECT" in the last 72 hours. Thyroid function studies No results for input(s): "TSH", "T4TOTAL", "T3FREE", "THYROIDAB" in the last 72 hours.  Invalid input(s): "FREET3" Anemia work up No results for input(s): "VITAMINB12", "FOLATE", "FERRITIN", "TIBC", "IRON", "RETICCTPCT" in the last 72 hours. Urinalysis    Component Value Date/Time   COLORURINE YELLOW (A) 05/04/2023 1449   APPEARANCEUR CLEAR (A) 05/04/2023 1449   LABSPEC 1.013 05/04/2023 1449   PHURINE 5.0 05/04/2023 1449   GLUCOSEU NEGATIVE 05/04/2023 1449   HGBUR NEGATIVE 05/04/2023 1449   BILIRUBINUR NEGATIVE 05/04/2023 1449   KETONESUR NEGATIVE 05/04/2023 1449   PROTEINUR 30 (A) 05/04/2023 1449   NITRITE NEGATIVE 05/04/2023 1449   LEUKOCYTESUR NEGATIVE 05/04/2023 1449   Sepsis Labs Recent Labs  Lab 05/04/23 1425  WBC 5.8   Microbiology No results found for this or any previous visit (from the past 240 hours). Imaging No results found.    Time coordinating discharge: over 30 minutes  SIGNED:  Sunnie Nielsen DO [facility]

## 2023-05-05 NOTE — Progress Notes (Signed)
 Patient beginning to calm down some. Still restless in bed but much less thrashing.

## 2023-05-05 NOTE — ED Notes (Signed)
 Advised nurse that patient has ready bed

## 2023-05-05 NOTE — Progress Notes (Signed)
 Daughter at bedside. Updated on care of patient so far this shift. Daughter very pleasant. She states she wants patient to be comfortable as she has witnessed patient agitation and thrashing and yelling in pain at the facility. This was very upsetting to her and she shared that her mom would not want to suffer this way.

## 2023-05-05 NOTE — Progress Notes (Signed)
 Patient now moaning with loud vocalization continuously. Attempted to hold patient hand to console and she was gripping it very hard. Patient medicated for pain.

## 2023-05-05 NOTE — TOC Transition Note (Signed)
 Transition of Care [facility]) - Discharge Note   Patient Details  Name: Courtney Holt MRN: [id number] Date of Birth: [birth year]  [facility] CM/SW Contact:  Bing Quarry, RN Phone Number: 05/05/2023, 3:47 PM   Clinical Narrative:  4/6: Patient discharging to [facility] hospice facility via [facility]. [facility] contacted for transportation and will pick up at 4 pm today. Report to be called to [phone]. EMS forms updated and printed to Unit RN.    Gabriel Cirri MSN RN CM  RN Case Manager [place]  Transitions of Care Direct Dial: [phone] (Weekends Only) [facility] Main Office Phone: [phone] [facility] Fax: [phone] [email or web address]     Final next level of care: Hospice Medical Facility Barriers to Discharge: Barriers Resolved   Patient Goals and CMS Choice     Choice offered to / list presented to : Adult Children      Discharge Placement                Patient to be transferred to facility by: [facility] Name of family member notified: Rhetta Mura (Daughter)  [phone] [facility]) Patient and family notified of of transfer: 05/05/23 (via Production manager.)  Discharge Plan and Services Additional resources added to the After Visit Summary for                  DME Arranged: N/A DME Agency: NA       HH Arranged: NA HH Agency: NA        Social Drivers of Health (SDOH) Interventions SDOH Screenings   Tobacco Use: High Risk (04/09/2023)     Readmission Risk Interventions     No data to display

## 2023-05-05 NOTE — Progress Notes (Signed)
 Report provided to [facility] RN with [facility].

## 2023-05-30 DEATH — deceased
